# Patient Record
Sex: Female | Born: 1965 | Race: Black or African American | Hispanic: No | Marital: Single | Smoking: Never smoker
Health system: Southern US, Community
[De-identification: ages and names within clinical notes are randomized; demographics above are authoritative.]

## PROBLEM LIST (undated history)

## (undated) DIAGNOSIS — Z8619 Personal history of other infectious and parasitic diseases: Secondary | ICD-10-CM

## (undated) DIAGNOSIS — D259 Leiomyoma of uterus, unspecified: Secondary | ICD-10-CM

## (undated) DIAGNOSIS — R7303 Prediabetes: Secondary | ICD-10-CM

## (undated) DIAGNOSIS — Z8742 Personal history of other diseases of the female genital tract: Secondary | ICD-10-CM

## (undated) DIAGNOSIS — D509 Iron deficiency anemia, unspecified: Secondary | ICD-10-CM

## (undated) DIAGNOSIS — M1990 Unspecified osteoarthritis, unspecified site: Secondary | ICD-10-CM

## (undated) DIAGNOSIS — I10 Essential (primary) hypertension: Secondary | ICD-10-CM

## (undated) DIAGNOSIS — Z8719 Personal history of other diseases of the digestive system: Secondary | ICD-10-CM

## (undated) DIAGNOSIS — N921 Excessive and frequent menstruation with irregular cycle: Secondary | ICD-10-CM

## (undated) DIAGNOSIS — Z87442 Personal history of urinary calculi: Secondary | ICD-10-CM

## (undated) HISTORY — DX: Essential (primary) hypertension: I10

## (undated) HISTORY — DX: Excessive and frequent menstruation with irregular cycle: N92.1

## (undated) HISTORY — PX: TONSILLECTOMY: SUR1361

## (undated) HISTORY — DX: Iron deficiency anemia, unspecified: D50.9

---

## 2002-06-30 ENCOUNTER — Encounter: Payer: Self-pay | Admitting: Emergency Medicine

## 2002-06-30 ENCOUNTER — Emergency Department (HOSPITAL_COMMUNITY): Admission: EM | Admit: 2002-06-30 | Discharge: 2002-06-30 | Payer: Self-pay | Admitting: Emergency Medicine

## 2003-08-03 ENCOUNTER — Emergency Department (HOSPITAL_COMMUNITY): Admission: EM | Admit: 2003-08-03 | Discharge: 2003-08-03 | Payer: Self-pay | Admitting: Emergency Medicine

## 2004-02-13 ENCOUNTER — Other Ambulatory Visit: Admission: RE | Admit: 2004-02-13 | Discharge: 2004-02-13 | Payer: Self-pay | Admitting: Obstetrics and Gynecology

## 2005-05-12 ENCOUNTER — Other Ambulatory Visit: Admission: RE | Admit: 2005-05-12 | Discharge: 2005-05-12 | Payer: Self-pay | Admitting: Obstetrics & Gynecology

## 2006-06-08 ENCOUNTER — Other Ambulatory Visit: Admission: RE | Admit: 2006-06-08 | Discharge: 2006-06-08 | Payer: Self-pay | Admitting: Obstetrics & Gynecology

## 2006-07-26 ENCOUNTER — Other Ambulatory Visit: Admission: RE | Admit: 2006-07-26 | Discharge: 2006-07-26 | Payer: Self-pay | Admitting: Obstetrics & Gynecology

## 2007-03-08 ENCOUNTER — Other Ambulatory Visit: Admission: RE | Admit: 2007-03-08 | Discharge: 2007-03-08 | Payer: Self-pay | Admitting: Obstetrics and Gynecology

## 2007-07-19 ENCOUNTER — Other Ambulatory Visit: Admission: RE | Admit: 2007-07-19 | Discharge: 2007-07-19 | Payer: Self-pay | Admitting: Obstetrics and Gynecology

## 2007-09-21 ENCOUNTER — Encounter: Admission: RE | Admit: 2007-09-21 | Discharge: 2007-09-21 | Payer: Self-pay | Admitting: Obstetrics and Gynecology

## 2008-12-13 ENCOUNTER — Encounter: Admission: RE | Admit: 2008-12-13 | Discharge: 2008-12-13 | Payer: Self-pay | Admitting: Obstetrics and Gynecology

## 2009-02-27 HISTORY — PX: CYSTOSCOPY W/ URETERAL STENT PLACEMENT: SHX1429

## 2009-04-18 HISTORY — PX: OTHER SURGICAL HISTORY: SHX169

## 2009-12-31 ENCOUNTER — Ambulatory Visit (HOSPITAL_COMMUNITY): Admission: RE | Admit: 2009-12-31 | Discharge: 2009-12-31 | Payer: Self-pay | Admitting: Obstetrics & Gynecology

## 2009-12-31 HISTORY — PX: ROBOTIC ASSISTED DIAGNOSTIC LAPAROSCOPY: SHX6532

## 2010-03-03 ENCOUNTER — Encounter: Admission: RE | Admit: 2010-03-03 | Discharge: 2010-03-03 | Payer: Self-pay | Admitting: Obstetrics & Gynecology

## 2010-03-07 ENCOUNTER — Encounter
Admission: RE | Admit: 2010-03-07 | Discharge: 2010-03-07 | Payer: Self-pay | Source: Home / Self Care | Attending: Obstetrics & Gynecology | Admitting: Obstetrics & Gynecology

## 2010-06-11 LAB — CBC
HCT: 39 % (ref 36.0–46.0)
MCH: 28 pg (ref 26.0–34.0)
MCHC: 33.5 g/dL (ref 30.0–36.0)
MCV: 83.8 fL (ref 78.0–100.0)
Platelets: 358 10*3/uL (ref 150–400)
RDW: 13.9 % (ref 11.5–15.5)
WBC: 5.3 10*3/uL (ref 4.0–10.5)

## 2010-06-11 LAB — SURGICAL PCR SCREEN: MRSA, PCR: NEGATIVE

## 2010-06-11 LAB — BASIC METABOLIC PANEL
BUN: 12 mg/dL (ref 6–23)
CO2: 28 mEq/L (ref 19–32)
Chloride: 104 mEq/L (ref 96–112)
Creatinine, Ser: 0.88 mg/dL (ref 0.4–1.2)
Glucose, Bld: 94 mg/dL (ref 70–99)
Potassium: 3.8 mEq/L (ref 3.5–5.1)

## 2011-11-17 ENCOUNTER — Other Ambulatory Visit: Payer: Self-pay | Admitting: Obstetrics & Gynecology

## 2011-11-17 ENCOUNTER — Ambulatory Visit
Admission: RE | Admit: 2011-11-17 | Discharge: 2011-11-17 | Disposition: A | Discharge status: Home / Self Care | Payer: BC Managed Care – PPO | Source: Ambulatory Visit | Attending: Obstetrics & Gynecology | Admitting: Obstetrics & Gynecology

## 2011-11-17 DIAGNOSIS — Z1231 Encounter for screening mammogram for malignant neoplasm of breast: Secondary | ICD-10-CM

## 2012-08-19 ENCOUNTER — Encounter: Payer: Self-pay | Admitting: Certified Nurse Midwife

## 2012-08-23 ENCOUNTER — Ambulatory Visit (INDEPENDENT_AMBULATORY_CARE_PROVIDER_SITE_OTHER): Payer: BC Managed Care – PPO | Admitting: Certified Nurse Midwife

## 2012-08-23 ENCOUNTER — Encounter: Payer: Self-pay | Admitting: Certified Nurse Midwife

## 2012-08-23 VITALS — BP 120/84 | Wt 222.0 lb

## 2012-08-23 DIAGNOSIS — N946 Dysmenorrhea, unspecified: Secondary | ICD-10-CM

## 2012-08-23 DIAGNOSIS — B373 Candidiasis of vulva and vagina: Secondary | ICD-10-CM

## 2012-08-23 DIAGNOSIS — N760 Acute vaginitis: Secondary | ICD-10-CM

## 2012-08-23 MED ORDER — METRONIDAZOLE 0.75 % VA GEL: 1.0000 | Freq: Two times a day (BID) | VAGINAL | Status: DC

## 2012-08-23 MED ORDER — IBUPROFEN 800 MG PO TABS: 800.0000 mg | ORAL_TABLET | Freq: Three times a day (TID) | ORAL | Status: DC | PRN

## 2012-08-23 MED ORDER — FLUCONAZOLE 150 MG PO TABS: 150.0000 mg | ORAL_TABLET | Freq: Once | ORAL | Status: DC

## 2012-08-23 NOTE — Progress Notes (Signed)
47 y.o.SingleAfrican American female G0P0000 with a 3 months  history of the following:discharge described as white, milky and slight odor Sexually active: no Last sexual activity:unknown.  Declines STD concerns or testing desired. Pt also reports the following associated symptoms: none Patient has not tried over the counter treatment. Patient complaining she had to come in for the appointment instead of being treated over the phone. Patient also unhappy with Micronor use for contraception.  Aware of bleeding profile per previous visit consult regarding, but still not happy with.  Patient still has dysmenorrhea with use.  Uses OTC pain medication for relief, which helps somewhat. Patient continues on hypertensive medication, with somewhat good control. Strong family history of hypertension, CVD.  O: Healthy obese female Affect: Anxious, orientation x 3 Skin: warm and dry   Exam:  WGN:FAOZHY, Bartholin's, Urethra, Skene's normal                QMV:HQIONGEXB: white, thin and malodorous, pH 5.0, wet prep done                Cx:  normal appearance and non tender                Uterus:nodular, upper limits of normal size                Adnexa: normal adnexa and no mass, fullness, tenderness  Wet Prep shows:clue cells and yeast, negative for trich   A: Normal pelvic exam with nodular uterus consistent with history of fibroids. Bacterial vaginosis Yeast vaginitis Dysmenorrhea   P: Reviewed findings of yeast vaginitis and bacterial vaginitis Rx Diflucan see order Rx Metrogel see order Aveeno or baking sitz bath prn comfort. Discussed with patient that we had dicussed other  options for contraceptive use and management of dysmenorrhea at previous visit 04-08-12 and she had declined. Patient offered visit with MD, declined.  Considering IVF so doesn't want to do anything at this point even though she is unhappy with choice.  Will advise if desires change.  Rv prn    Reviewed, TL

## 2012-09-01 ENCOUNTER — Telehealth: Payer: Self-pay | Admitting: Certified Nurse Midwife

## 2012-09-01 NOTE — Telephone Encounter (Signed)
Patient came in last Wednesday and was Dx'ed with a yeast infection and a bacterial infection. Was given meds for this . Has not fully gone away. Please call  Patient is out of town at this time [facility]. Patient is in [place]

## 2012-09-01 NOTE — Telephone Encounter (Signed)
Pt was treated for BV and Yeast, has taken all of prescribed medication symptoms are much better but still having a cottage cheese discharge. Took diflucan Sunday 4 days later not any better Pt asking if we can call in another round of Diflucan. cm

## 2012-09-01 NOTE — Telephone Encounter (Signed)
PATIENT NOTIFIED OF D. LEONARD RESPONSE TO PATIENT YEAST INFECTION. PATIENT UNDERSTANDS RESPONSE FROM D. LEONARD.

## 2012-09-01 NOTE — Telephone Encounter (Signed)
It takes approximately 7 days to clear after second dose, can use aveeno sitz bath for comfort, if not resolving by then OV

## 2012-09-28 ENCOUNTER — Other Ambulatory Visit: Payer: Self-pay | Admitting: Certified Nurse Midwife

## 2012-09-28 NOTE — Telephone Encounter (Signed)
AEX scheduled for 11/21/12 #28/2 refills sent to last pt until AEX

## 2012-10-07 ENCOUNTER — Encounter: Payer: Self-pay | Admitting: Certified Nurse Midwife

## 2012-10-07 ENCOUNTER — Ambulatory Visit (INDEPENDENT_AMBULATORY_CARE_PROVIDER_SITE_OTHER): Payer: BC Managed Care – PPO | Admitting: Certified Nurse Midwife

## 2012-10-07 VITALS — BP 110/72 | HR 60 | Resp 16 | Ht 61.75 in | Wt 219.0 lb

## 2012-10-07 DIAGNOSIS — N760 Acute vaginitis: Secondary | ICD-10-CM

## 2012-10-07 MED ORDER — METRONIDAZOLE 0.75 % VA GEL: 1.0000 | Freq: Every day | VAGINAL | Status: DC

## 2012-10-07 NOTE — Progress Notes (Signed)
47 y.o.SingleAfrican American female G0P0000 with BV treated here which she thinks  never cleared from May 2014 history of the following:discharge described as watery yellow discharge with odor off and on Sexually active: yes. Contraception: Errin  Pt also reports the following associated symptoms: none Patient has not tried over the counter treatment. Feels like it is the same as before. Patient currently has had an abscess tooth for the past 2 months and has surgery scheduled to repair. Worried this will affect her vaginal discharge from clearing. No new personal products or STD concerns    O: Healthy overweight female Affect:normal, orientation x 3    Exam:  ZOX:WRUEAV, Bartholin's, Urethra, Skene's normal                WUJ:WJXBJYNWG: yellow, watery and malodorous, pH 5.5, wet prep done                Cx:  normal appearance and non tender, no CMT                Uterus:firm nodular, consistent with history of fibroids                Adnexa: normal adnexa and no mass, fullness, tenderness  Wet Prep shows:Positive for BV, negative for yeast, Trich   A: BV, chronic? 2-Abscess tooth under treatment  P:Discussed findings with patient.  Rx Metrogel see order. Patient to treat with Metrogel she has at home for 5 days, start on oral probiotic, then one applicator only of Metrogel after period for the next 3 months and then re-evaluate. Patient agreeable with plan. 2-Continue follow up as indicated with tooth.  Rv prn  Reviewed, TL

## 2012-10-11 ENCOUNTER — Telehealth: Payer: Self-pay | Admitting: Certified Nurse Midwife

## 2012-10-11 NOTE — Telephone Encounter (Signed)
Patient notified of instructions per D.Leonard response. Patient understands this and was appreciative of call.

## 2012-10-11 NOTE — Telephone Encounter (Signed)
Patient calling with concerns of already being Treated for yeast and BV. States yeast infection has not gone away and is worse with more discharge of cottage cheese discharge . Took Diflucan as ordered and is on 5th day of Metrogel at [employer]. States there is no itching , no burning, no stinging. Only discharge that she can feel and see. She is taking a Probiotic as suggested due to her schedule of dental procedure on July 25th/ please advise .  Last AEX 11/17/2011 Last Office Visit 10/07/2012

## 2012-10-11 NOTE — Telephone Encounter (Signed)
Left message on CB# to return call .

## 2012-10-11 NOTE — Telephone Encounter (Signed)
We discussed the discharge is to be expected this is the medication working to clear out the overgrowth of the bacteria. Continue medication as prescribed. The discharge should last about a week and then gradually decrease the second week.  She can use aveeno bath to help with feeling fresh with the discharge.

## 2012-10-11 NOTE — Telephone Encounter (Signed)
Patient was seen last Friday 10/07/12 for a bacterial infection, patient thinks she really has a yeast infection. Patient would like to talk to a nurse.

## 2012-11-21 ENCOUNTER — Ambulatory Visit: Payer: Self-pay | Admitting: Certified Nurse Midwife

## 2012-11-23 ENCOUNTER — Ambulatory Visit: Payer: Self-pay | Admitting: Certified Nurse Midwife

## 2012-12-22 ENCOUNTER — Other Ambulatory Visit: Payer: Self-pay | Admitting: Certified Nurse Midwife

## 2012-12-23 NOTE — Telephone Encounter (Signed)
eScribe request for refill on CAMILA Last filled - 11/19/11 X 1 YEAR Last AEX - 11/17/11 Next AEX - not scheduled 1 month supply sent with note and phone call that pt needs AEX for more refills.

## 2013-01-19 ENCOUNTER — Telehealth: Payer: Self-pay | Admitting: Certified Nurse Midwife

## 2013-01-19 NOTE — Telephone Encounter (Signed)
Patient requesting a refill on Camilla please? AEX 01/26/13 with Ortencia Kick.  [facility]

## 2013-01-20 MED ORDER — NORETHINDRONE 0.35 MG PO TABS: 1.0000 | ORAL_TABLET | Freq: Every day | ORAL | Status: DC

## 2013-01-20 NOTE — Telephone Encounter (Signed)
AEX scheduled for 01/26/13 #28/0rf's sent to last patient until AEX.  Lm on patient's VM that refill has been sent and that it's important for her to make her Appt before she she can get any additional refills.

## 2013-01-26 ENCOUNTER — Ambulatory Visit: Payer: BC Managed Care – PPO | Admitting: Nurse Practitioner

## 2013-01-26 ENCOUNTER — Ambulatory Visit (INDEPENDENT_AMBULATORY_CARE_PROVIDER_SITE_OTHER): Payer: BC Managed Care – PPO | Admitting: Nurse Practitioner

## 2013-01-26 ENCOUNTER — Encounter: Payer: Self-pay | Admitting: Nurse Practitioner

## 2013-01-26 ENCOUNTER — Ambulatory Visit: Payer: Self-pay | Admitting: Certified Nurse Midwife

## 2013-01-26 VITALS — BP 132/84 | HR 68 | Resp 16 | Ht 62.0 in | Wt 225.0 lb

## 2013-01-26 DIAGNOSIS — Z113 Encounter for screening for infections with a predominantly sexual mode of transmission: Secondary | ICD-10-CM

## 2013-01-26 DIAGNOSIS — Z0000 Encounter for general adult medical examination without abnormal findings: Secondary | ICD-10-CM

## 2013-01-26 DIAGNOSIS — D219 Benign neoplasm of connective and other soft tissue, unspecified: Secondary | ICD-10-CM

## 2013-01-26 DIAGNOSIS — I10 Essential (primary) hypertension: Secondary | ICD-10-CM

## 2013-01-26 DIAGNOSIS — Z01419 Encounter for gynecological examination (general) (routine) without abnormal findings: Secondary | ICD-10-CM

## 2013-01-26 DIAGNOSIS — D259 Leiomyoma of uterus, unspecified: Secondary | ICD-10-CM

## 2013-01-26 DIAGNOSIS — R319 Hematuria, unspecified: Secondary | ICD-10-CM

## 2013-01-26 LAB — POCT URINALYSIS DIPSTICK
Bilirubin, UA: NEGATIVE
Ketones, UA: NEGATIVE
Protein, UA: NEGATIVE
pH, UA: 6

## 2013-01-26 MED ORDER — NORETHINDRONE 0.35 MG PO TABS: 1.0000 | ORAL_TABLET | Freq: Every day | ORAL | Status: DC

## 2013-01-26 NOTE — Patient Instructions (Signed)

## 2013-01-26 NOTE — Progress Notes (Signed)
Patient ID: Angel Bailey, female   DOB: 1965/06/20, 47 y.o.   MRN: [id number] 47 y.o. G0P0 Single African American Fe here for annual exam. patient is on HTN med's and therefore on Camilla.  Menses is usually 9-11 days.  Heavy for   3 days. Ultra tampon changing every 1.5 hours. cramps on heavy days with low back pain that occurs 1 week prior to cycle .  Vaginal discharge for almost a year.  Treated twice with yeast then for BV.  This summer she had a dental abscess, then diagnosed with BV and tried treatment monthly with Metrogel after the cycle for 1 day.  No treatment seems to help.  States discharge is daily without odor.  Occasionally white in color.  Same partner for several years but not sexually active.  He has a 51 &11 year old child. She feels that he is not as committed to a baby as she is.  Still holding out hope that one day she may want a pregnancy but also OK to adopt.  Patient's last menstrual period was 01/07/2013.          Sexually active: no  The current method of family planning is POP (progesterone).    Exercising: no  The patient does not participate in regular exercise at present. Smoker:  no  Health Maintenance: Pap:  11/17/11, WNL, pos HR HPV, neg 16/18 MMG:  11/17/11, Bi-Rads 1: negative TDaP:  ? Labs: HB: 9.9   Urine: trace blood, small leuk's, 6.0 pH asymptomatic   reports that she has never smoked. She has never used smokeless tobacco. She reports that she drinks alcohol. She reports that she does not use illicit drugs.  Past Medical History  Diagnosis Date  . HSV (herpes simplex virus) infection   . HPV (human papilloma virus) infection   . Anemia   . STD (sexually transmitted disease)     positive Gonorrhea 3/08  . Fibroid     largest 2.4 cm  . Hypertension   . Kidney stone     Past Surgical History  Procedure Laterality Date  . Kidney stone surgery  1/11    stint  . Laparoscopic salpingoopherectomy Left 12/31/09    with extensive lysis of adhesions     Current Outpatient Prescriptions  Medication Sig Dispense Refill  . BIOTIN PO Take by mouth daily.      Marland Kitchen ibuprofen (ADVIL,MOTRIN) 800 MG tablet Take 1 tablet (800 mg total) by mouth every 8 (eight) hours as needed for pain. Take with food every 8 hours for cramping x 24 hours  30 tablet  1  . norethindrone (CAMILA) 0.35 MG tablet Take 1 tablet (0.35 mg total) by mouth daily. Must have AEX for more refills.  3 Package  3  . Prenatal Vit-Fe Sulfate-FA (PRENATAL VITAMIN PO) Take by mouth daily.      . valsartan-hydrochlorothiazide (DIOVAN-HCT) 160-25 MG per tablet Take 1 tablet by mouth daily.       No current facility-administered medications for this visit.    Family History  Problem Relation Age of Onset  . Hypertension Mother   . Diabetes Mother   . Hypertension Father   . Hypertension Maternal Grandmother     ROS:  Pertinent items are noted in HPI.  Otherwise, a comprehensive ROS was negative.  Exam:   BP 132/84  Pulse 68  Resp 16  Ht 5\' 2"  (1.575 m)  Wt 225 lb (102.059 kg)  BMI 41.14 kg/m2  LMP 01/07/2013 Height: 5\' 2"  (  157.5 cm)  Ht Readings from Last 3 Encounters:  01/26/13 5\' 2"  (1.575 m)  10/07/12 5' 1.75" (1.568 m)   During labs patient was near syncopal. Recovered quickly.  General appearance: alert, cooperative and appears stated age Head: Normocephalic, without obvious abnormality, atraumatic Neck: no adenopathy, supple, symmetrical, trachea midline and thyroid normal to inspection and palpation Lungs: clear to auscultation bilaterally Breasts: normal appearance, no masses or tenderness Heart: regular rate and rhythm Abdomen: soft, non-tender; no masses,  no organomegaly Extremities: extremities normal, atraumatic, no cyanosis or edema Skin: Skin color, texture, turgor normal. No rashes or lesions Lymph nodes: Cervical, supraclavicular, and axillary nodes normal. No abnormal inguinal nodes palpated Neurologic: Grossly normal   Pelvic: External  genitalia:  no lesions              Urethra:  normal appearing urethra with no masses, tenderness or lesions              Bartholin's and Skene's: normal                 Vagina: normal appearing vagina with normal color and discharge, no lesions. No vaginal discharge today              Cervix: anteverted              Pap taken: yes along with STD's Bimanual Exam:  Uterus:  enlarged, 10 weeks size              Adnexa: normal adnexa               Rectovaginal: Confirms               Anus:  normal sphincter tone, no lesions  A:  Well Woman with normal exam  History of menorrhagia and anemia on POP for almost a year  History of HTN  History of UTE fibroids,  With Laparoscopic LOA and endometrioma with LSO 12/31/09  History of normal pap with + HR HPV; negative # 16 & 18  - 10/13  History of recurrent vaginitis no help with BV / yeast med's  R/O STD's  P:   Pap smear as per guidelines Will follow this pap  Mammogram due now and will schedule  Discussed various treatment options with HTA, Mirena IUD, surgical option.  She became very emotional and did not want to do anything if she could not get pregnant in the future.  She did agree to PUS/ [facility] with Dr. Hyacinth Meeker.  Will need Cytotec.  Counseled on breast self exam, adequate intake of calcium and vitamin D, diet and exercise return annually or prn

## 2013-01-26 NOTE — Progress Notes (Signed)
Reviewed personally.  M. Suzanne Norine Reddington, MD.  

## 2013-01-27 LAB — STD PANEL

## 2013-01-28 LAB — IPS N GONORRHOEA AND CHLAMYDIA BY PCR

## 2013-01-28 LAB — URINE CULTURE

## 2013-01-30 ENCOUNTER — Telehealth: Payer: Self-pay | Admitting: Nurse Practitioner

## 2013-01-30 DIAGNOSIS — N920 Excessive and frequent menstruation with regular cycle: Secondary | ICD-10-CM

## 2013-01-30 LAB — IPS PAP TEST WITH HPV

## 2013-01-30 NOTE — Telephone Encounter (Signed)
Spoke with patient in regards to her benefits for a SHGM. Patient will call back to schedule, she does not have her work schedule in front of her. Patient would like to know the results of her labs from 10/30. Please advise.

## 2013-01-30 NOTE — Telephone Encounter (Signed)
LMTCB to discuss ins benefits for a SHGM and to schedule.   LMP: 01/07/13 usually lasts 9-11 days Schedule with MSM.

## 2013-01-31 NOTE — Telephone Encounter (Signed)
Patient calling to schedule her [facility] and also to request her recent results please?

## 2013-01-31 NOTE — Telephone Encounter (Signed)
Kennon Rounds I have tried to route this to Dr. Hyacinth Meeker and I am unable to do this.  Will  you send to her.

## 2013-01-31 NOTE — Telephone Encounter (Signed)
Return call to patient, advised of lab and pap results.  Discussed OTC iron supplement daily and to take with Vit c and avoid taking with calcium.  Will mail list of iron rich foods.  Patient on POP and state not sexually active.  Bleeding is irregular and unpredictable and lasts 10 days when starts. PUS/SHGM scheduled for 02-09-13. Instructed on use of Cytotec and to take Motrin 800 mg 1 hour prior with food.  States this has not been helping her so discussed trying Aleve instead. Advised ok to have procedure if bleeding as long as she is not uncomfortable with this. Orders entered, added poss endo biopsy in case needed. Cytotec entered.  Routing to provider for final review. Patient agreeable to disposition. Will close encounter

## 2013-02-01 MED ORDER — MISOPROSTOL 200 MCG PO TABS: ORAL_TABLET | ORAL | Status: DC

## 2013-02-01 NOTE — Telephone Encounter (Signed)
Encounter was routed to both Patty and Dr Hyacinth Meeker on 01-31-13 for review since is Patty's patient and reviewed by Dr Hyacinth Meeker who will do procedure.

## 2013-02-01 NOTE — Telephone Encounter (Signed)
Routed to Dr. Hyacinth Meeker per Patty's request.

## 2013-02-03 ENCOUNTER — Other Ambulatory Visit: Payer: Self-pay | Admitting: Certified Nurse Midwife

## 2013-02-03 NOTE — Telephone Encounter (Signed)
RX x 1 year sent on 01/26/13.  RX denied.

## 2013-02-09 ENCOUNTER — Ambulatory Visit (INDEPENDENT_AMBULATORY_CARE_PROVIDER_SITE_OTHER): Payer: BC Managed Care – PPO | Admitting: Obstetrics & Gynecology

## 2013-02-09 ENCOUNTER — Ambulatory Visit (INDEPENDENT_AMBULATORY_CARE_PROVIDER_SITE_OTHER): Payer: BC Managed Care – PPO

## 2013-02-09 ENCOUNTER — Other Ambulatory Visit: Payer: Self-pay | Admitting: Obstetrics & Gynecology

## 2013-02-09 DIAGNOSIS — N920 Excessive and frequent menstruation with regular cycle: Secondary | ICD-10-CM

## 2013-02-09 DIAGNOSIS — I10 Essential (primary) hypertension: Secondary | ICD-10-CM

## 2013-02-09 DIAGNOSIS — D259 Leiomyoma of uterus, unspecified: Secondary | ICD-10-CM

## 2013-02-09 DIAGNOSIS — D649 Anemia, unspecified: Secondary | ICD-10-CM

## 2013-02-09 DIAGNOSIS — D219 Benign neoplasm of connective and other soft tissue, unspecified: Secondary | ICD-10-CM

## 2013-02-09 NOTE — Progress Notes (Signed)
47 y.o.Singlefemale here for a pelvic ultrasound with sonohystogram due to menorrhagia, anemia, and h/o uterine fibroids.  Bleeding is much worse on miconor which was initiated due to her hypertension.  Pt here for u/s to see if there are any changes and possible options for treatment.  She really wants to maintain her fertility, although she clearly realizes her age is also a factor.  Not in serious relationship.  She is contemplating donor egg, if personal life circumstances allowed for this.  Indication: menorrhagia, anemia, h/o fibroids  Patient's last menstrual period was 02/04/2013.  Contraception: micronor  Technique:  Both transabdominal and transvaginal ultrasound examinations of the pelvis were performed. Transabdominal technique was performed for global imaging of the pelvis including uterus, ovaries, adnexal regions, and pelvic cul-de-sac. It was necessary to proceed with endovaginal exam following the abdominal ultrasound transabdominal exam to visualize the endometrium and adnexa. Color and duplex Doppler ultrasound was utilized to evaluate blood flow to the ovaries.   FINDINGS: UTERUS: 11.0 x 8.4 x 7.2cm with 3 fibroids--3.5 x 3.0 x 3.0cm, 5.9 x 6 x 7cm, and 1.8 x 1.7 x 1.9cm.  Largest is larger than prior exam in 2011 where was 3cm.  Overall uterine size is large by 3cm in almost each dimension. EMS: very difficult to evaluate due to distortion from fibroids ADNEXA: Left--surgically absent       Right--4.9 x 4.0 x 3.1cm with 32mm cyst with thick walls, internal scattered low level echoes.  Avascular. CUL DE SAC: no free fluid  SHSG:  After obtaining appropriate verbal consent from patient, the cervix was visualized using a speculum, and prepped with betadine.  A tenaculum  was applied to the cervix.  Dilation of the cervix was not necessary. The catheter was passed into the uterus and sterile saline introduced, with the following findings: endometrial cavity has 90 degree angle due  to distortion from fibroid.  Clot noted in cavity.  No intracavitary lesions.  Largest fibroid is very close to edge of endometrium.  Images and results disucss with pt.  I do not feel that she is a good candidate for considering estrogen containing OCPs.  Risk of stroke discussed with pt.  She voices understanding.  Mirena IUD also not a good option due to shape of endometrial cavity and risk for perforation of uterus.  Pt not interested in hysterectomy.  Myomectomy discussed.  Open vs laparoscopic procedure discussed.  Risks of interruption of endometrial cavity with subsequent scar discussed.  Incision locations and risk for future fibroid growth in addition to need for future surgeries discussed.  Assessment:  Enlarging uterine fibroids/uterine size, menorrhagia, anemia  Plan:  Pt contemplating options.  Starting FeSo4 daily.  Recommended to take with OJ.  Pt will need repeat CBC in 2 months to watch anemia and ensure not worsening.  Pt very appreciative of time spent with her answering all her questions.  ~30 minutes spent with patient >50% of time was in face to face discussion of above.

## 2013-02-09 NOTE — Patient Instructions (Signed)
Myomectomy Myoma is a non-cancerous tumor made up of fibrous tissue. It is also called leiomyoma, but more often called a fibroid tumor. Myomectomy is the removal of a fibroid tumor without removing another organ, like the uterus or ovary, with it. Fibroids range from the size of a pea to a grapefruit. They are rarely cancerous. Myomas only need treatment when they are growing or when they cause symptoms, such aspain, pressure, bleeding, and pain with intercourse. LET YOUR CAREGIVER KNOW ABOUT:  Any allergies, especially to medicines.  If you develop a cold or an infection before your surgery.  Medicines taken, including vitamins, herbs, eyedrops, over-ther-counter medicines, and creams.  Use of steroids (by mouth or creams).  Previous problems with numbing medicines.  History of blood clots or other bleeding problems.  Other health problems, such as diabetes, kidney, heart, or lung problems.  Previous surgery.  Possibility of pregnancy, if this applies. RISKS AND COMPLICATIONS   Excessive bleeding.  Infection.  Injury to other organs.  Blood clots in the legs, chest, and brain.  Scar tissue (adhesions) on other organs and in the pelvis.  Death during or after the surgery. BEFORE THE PROCEDURE  Follow your caregiver's advice regarding your surgery and preparing for surgery.  Avoid taking aspirin or blood thinners as directed by your caregiver.  DO NOT eat or drink anything after midnight on the night before surgery, or as directed by your caregiver.  DO NOT smoke (if you smoke) for 2 weeks before the surgery.  DO NOT drink alcohol the day before the surgery.  If you are admitted the day of the surgery,arrive1 hour before your surgery is scheduled.  Arrange to have someone take you home from the hospital.  Arrange to have someone care for you when you go home. PROCEDURE There are several ways to perform a myomectomy:  Hysteroscopy myomectomy. A lighted tube is  inserted inside the uterus. The tube will remove the fibroid. This is used when the fibroid is inside the cavity of the uterus.  Laparoscopic myomectomy. A long, lighted tube is inserted through 2 or 3 small incisions to see the organs in the pelvis. The fibroid is removed.  Myomectomy through a sugical cut (incicion) in the abdomen. The fibroid is removed through an incision made in the stomach. This way is performed when thethe fibroid cannot be removed with a hysteroscope or laprascope. AFTER THE PROCEDURE  If you had laparoscopic or hysteroscopic myomectomy, you may go home the same day or stay overnight.  If you had abdominal myomectomy, you may stay in the hospital a few days.  Your intravenous (IV)access tube and catheter will be removed in 1 or 2 days.  If you stay in the hospital, your caregiver will order pain medicine and a sleeping pill, if needed.  You may be placed on an antibiotic medicine, if needed.  You may be given written instructions and medicines before you are sent home. Document Released: 01/11/2007 Document Revised: 06/08/2011 Document Reviewed: 01/23/2009 [facility] Patient Information 2014 [place], [place].   [email or web address]  [email or web address]  Dr. April Manson

## 2013-02-10 ENCOUNTER — Encounter: Payer: Self-pay | Admitting: Obstetrics & Gynecology

## 2013-02-10 DIAGNOSIS — N920 Excessive and frequent menstruation with regular cycle: Secondary | ICD-10-CM | POA: Insufficient documentation

## 2013-02-10 DIAGNOSIS — D649 Anemia, unspecified: Secondary | ICD-10-CM | POA: Insufficient documentation

## 2013-02-10 DIAGNOSIS — I10 Essential (primary) hypertension: Secondary | ICD-10-CM | POA: Insufficient documentation

## 2013-02-10 DIAGNOSIS — D219 Benign neoplasm of connective and other soft tissue, unspecified: Secondary | ICD-10-CM | POA: Insufficient documentation

## 2013-03-20 ENCOUNTER — Other Ambulatory Visit: Payer: Self-pay | Admitting: Certified Nurse Midwife

## 2013-03-21 ENCOUNTER — Telehealth: Payer: Self-pay | Admitting: Nurse Practitioner

## 2013-03-21 NOTE — Telephone Encounter (Signed)
01/26/13 #90/3 [facility] S/w pharmacist they didn't have rx gave them a verbal order Called patient notified her of this and apologized for any incovenience Patient appreciative.

## 2013-03-21 NOTE — Telephone Encounter (Signed)
Patient called again saying that the pharmacy still doesn't have rx, I told the patient I called it in. Patient said she spoke with the pharmacist and they didn't have it. Patient is also needing one month called into [facility] since she isn't here, tried to have them transfer 1 month but they didn't have rx. Patient was anxious because she is 2 days late and needs to start her pack today.  Called pharmacy s/w pharmacist she said they do have the rx they just didn't put it in and they would, she apologized. I asked if they could please get it ready since the patient called [place] (twice) , they said they would.  [facility] in Camilla 0.35 mg #1 pack only (as patient requested)  Notified patient.

## 2013-03-21 NOTE — Telephone Encounter (Signed)
Angel [facility] [place] [place]. Patient says she did not get a prescription at her AEX.

## 2013-06-01 ENCOUNTER — Telehealth: Payer: Self-pay

## 2013-06-01 NOTE — Telephone Encounter (Signed)
Lmtcb//kn 

## 2013-06-01 NOTE — Telephone Encounter (Signed)
Message copied by Robley Fries on Thu Jun 01, 2013 11:27 AM ------      Message from: Megan Salon      Created: Fri May 19, 2013 10:41 PM      Regarding: CBC follow up       Pt hasn't come for follow up CBC.  Can you call and remind her?  Order is in [place]. ------

## 2013-06-07 ENCOUNTER — Encounter: Payer: Self-pay | Admitting: Nurse Practitioner

## 2013-06-07 ENCOUNTER — Other Ambulatory Visit (INDEPENDENT_AMBULATORY_CARE_PROVIDER_SITE_OTHER): Payer: BC Managed Care – PPO

## 2013-06-07 ENCOUNTER — Ambulatory Visit (INDEPENDENT_AMBULATORY_CARE_PROVIDER_SITE_OTHER): Payer: BC Managed Care – PPO | Admitting: Nurse Practitioner

## 2013-06-07 VITALS — BP 140/86 | HR 76 | Ht 62.0 in | Wt 232.0 lb

## 2013-06-07 DIAGNOSIS — D649 Anemia, unspecified: Secondary | ICD-10-CM

## 2013-06-07 DIAGNOSIS — B3731 Acute candidiasis of vulva and vagina: Secondary | ICD-10-CM

## 2013-06-07 DIAGNOSIS — B373 Candidiasis of vulva and vagina: Secondary | ICD-10-CM

## 2013-06-07 LAB — CBC
HEMATOCRIT: 32.1 % — AB (ref 36.0–46.0)
Hemoglobin: 9.9 g/dL — ABNORMAL LOW (ref 12.0–15.0)
MCH: 23.1 pg — ABNORMAL LOW (ref 26.0–34.0)
MCHC: 30.8 g/dL (ref 30.0–36.0)
MCV: 75 fL — ABNORMAL LOW (ref 78.0–100.0)
Platelets: 485 10*3/uL — ABNORMAL HIGH (ref 150–400)
RBC: 4.28 MIL/uL (ref 3.87–5.11)
RDW: 16.9 % — AB (ref 11.5–15.5)
WBC: 4.7 10*3/uL (ref 4.0–10.5)

## 2013-06-07 MED ORDER — FLUCONAZOLE 150 MG PO TABS: ORAL_TABLET | ORAL | Status: DC

## 2013-06-07 NOTE — Progress Notes (Signed)
Subjective:     Patient ID: Barbette Merino, female   DOB: [birth year], 48 y.o.   MRN: [id number]  HPI  This 48 yo G0 SAA Fe complains of vaginal symptoms on and off for a year and half.  She has been treated for yeast and bacterial vaginitis.  Currently the discharge is white and thick at time. Sometimes has an odor and itching. At one time with a tooth infection the symptoms were worse and did seem to improve with Metrogel.  She denies urinary symptoms. Not SA since 2010 and all STD's since then are negative.  Review of Systems  Constitutional: Negative for fever, chills and fatigue.  Gastrointestinal: Negative for nausea, vomiting, abdominal pain, diarrhea, constipation, blood in stool, abdominal distention and anal bleeding.  Genitourinary: Positive for vaginal discharge. Negative for dysuria, urgency, frequency, hematuria, flank pain, genital sores, vaginal pain and pelvic pain.  Musculoskeletal: Negative.   Neurological: Negative.   Psychiatric/Behavioral: Negative.        Objective:   Physical Exam  Constitutional: She is oriented to person, place, and time. She appears well-developed and well-nourished. She appears distressed.  Abdominal: Soft. She exhibits no distension. There is no tenderness. There is no rebound.  Genitourinary:  White thick vaginal discharge. No cervical erosion or other lesions.  Wet Prep:  Ph: 4.5; NSS: negative; KOH: + multiple yeast.  Neurological: She is alert and oriented to person, place, and time.  Psychiatric: She has a normal mood and affect. Her behavior is normal. Judgment and thought content normal.       Assessment:     Yeast vaginitis    Plan:     Diflucan 150 mg today and repeat in 3 days; then continue for another 2 doses at a weekly dosing until completed because of the chronic nature of her symptoms. If symptoms worsen in any way to call back.

## 2013-06-07 NOTE — Patient Instructions (Addendum)
Monilial Vaginitis Vaginitis in a soreness, swelling and redness (inflammation) of the vagina and vulva. Monilial vaginitis is not a sexually transmitted infection. CAUSES  Yeast vaginitis is caused by yeast (candida) that is normally found in your vagina. With a yeast infection, the candida has overgrown in number to a point that upsets the chemical balance. SYMPTOMS   White, thick vaginal discharge.  Swelling, itching, redness and irritation of the vagina and possibly the lips of the vagina (vulva).  Burning or painful urination.  Painful intercourse. DIAGNOSIS  Things that may contribute to monilial vaginitis are:  Postmenopausal and virginal states.  Pregnancy.  Infections.  Being tired, sick or stressed, especially if you had monilial vaginitis in the past.  Diabetes. Good control will help lower the chance.  Birth control pills.  Tight fitting garments.  Using bubble bath, feminine sprays, douches or deodorant tampons.  Taking certain medications that kill germs (antibiotics).  Sporadic recurrence can occur if you become ill. TREATMENT  Your caregiver will give you medication.  There are several kinds of anti monilial vaginal creams and suppositories specific for monilial vaginitis. For recurrent yeast infections, use a suppository or cream in the vagina 2 times a week, or as directed.  Anti-monilial or steroid cream for the itching or irritation of the vulva may also be used. Get your caregiver's permission.  Painting the vagina with methylene blue solution may help if the monilial cream does not work.  Eating yogurt may help prevent monilial vaginitis. HOME CARE INSTRUCTIONS   Finish all medication as prescribed.  Do not have sex until treatment is completed or after your caregiver tells you it is okay.  Take warm sitz baths.  Do not douche.  Do not use tampons, especially scented ones.  Wear cotton underwear.  Avoid tight pants and panty  hose.  Tell your sexual partner that you have a yeast infection. They should go to their caregiver if they have symptoms such as mild rash or itching.  Your sexual partner should be treated as well if your infection is difficult to eliminate.  Practice safer sex. Use condoms.  Some vaginal medications cause latex condoms to fail. Vaginal medications that harm condoms are:  Cleocin cream.  Butoconazole (Femstat).  Terconazole (Terazol) vaginal suppository.  Miconazole (Monistat) (may be purchased over the counter). SEEK MEDICAL CARE IF:   You have a temperature by mouth above 102 F (38.9 C).  The infection is getting worse after 2 days of treatment.  The infection is not getting better after 3 days of treatment.  You develop blisters in or around your vagina.  You develop vaginal bleeding, and it is not your menstrual period.  You have pain when you urinate.  You develop intestinal problems.  You have pain with sexual intercourse. Document Released: 12/24/2004 Document Revised: 06/08/2011 Document Reviewed: 09/07/2008 [facility] Patient Information 2014 [place], [place].   Diflucan 150 mg today  And repeat in 3 days, then on 3/21/ and 3/28 repeat 1 tablet of Diflucan

## 2013-06-13 NOTE — Progress Notes (Signed)
Encounter reviewed by Dr. Camylle Whicker Silva.  

## 2013-07-06 ENCOUNTER — Other Ambulatory Visit: Payer: Self-pay

## 2013-07-06 DIAGNOSIS — Z1231 Encounter for screening mammogram for malignant neoplasm of breast: Secondary | ICD-10-CM

## 2013-07-12 ENCOUNTER — Telehealth: Payer: Self-pay | Admitting: Nurse Practitioner

## 2013-07-12 NOTE — Telephone Encounter (Signed)
Spoke with patient. Advised not to take any extra tablets. Advised to start a new pack and take one pill each day of new pack. She has started a period which I advised patient is normal after being off POPs for 4 days. Denies bleeding concerns, states that periods "for me,they are always heavy, but I have fibroids."  Patient does not want to drive today so she will pick up new pack tomorrow and start new pack. Advised will need to use back up method of birth control for one month and patient states she is not concerned, she is not sexually active. Patient verbalized understanding of instructions.   Routing to provider for final review. Patient agreeable to disposition. Will close encounter

## 2013-07-12 NOTE — Telephone Encounter (Signed)
Patient was out of town and unable to get her birthcontrol. Patient was due to start taking them on Sunday does she needs to take all of the missing pills today?

## 2013-07-21 ENCOUNTER — Encounter (INDEPENDENT_AMBULATORY_CARE_PROVIDER_SITE_OTHER): Payer: Self-pay

## 2013-07-21 ENCOUNTER — Ambulatory Visit
Admission: RE | Admit: 2013-07-21 | Discharge: 2013-07-21 | Disposition: A | Discharge status: Home / Self Care | Payer: Self-pay | Source: Ambulatory Visit

## 2013-07-21 DIAGNOSIS — Z1231 Encounter for screening mammogram for malignant neoplasm of breast: Secondary | ICD-10-CM

## 2013-10-05 NOTE — Telephone Encounter (Signed)
Spoke with patient-states has had blood work with PCP. She will call their office to make sure her iron levels were checked as well and have them fax [place] the results. If she has not had them checked, will schedule appointment with us.//kn

## 2013-10-05 NOTE — Telephone Encounter (Signed)
Per DPR ok to leave detailed message on cell #-left message that she is overdue for repeat lab work and would she like to schedule this?//kn

## 2013-10-09 ENCOUNTER — Telehealth: Payer: Self-pay | Admitting: Certified Nurse Midwife

## 2013-10-09 MED ORDER — NORETHINDRONE 0.35 MG PO TABS: 1.0000 | ORAL_TABLET | Freq: Every day | ORAL | Status: DC

## 2013-10-09 NOTE — Telephone Encounter (Signed)
Refill request for Camuy Last filled by MD on - 01/26/13, #3 X 3 Last AEX - 01/26/13 Next AEX - 02/02/14  Pt transferred original RX to [place].  Per Tye Maryland at [facility], pt last had filled on 07/12/13 and has no refills.  She is unable to see any additional RX details.  [facility]ills [facility] to [facility] until AEX.  Confirmed location as phone number given does not match any phone numbers listed in EPIC for any [facility] in [place].  Message left at [phone] (H) per ROI that RX has been sent to pharmacy.

## 2013-10-09 NOTE — Telephone Encounter (Signed)
Pt [facility] on [facility] [place] at [phone].

## 2013-12-07 ENCOUNTER — Encounter: Payer: Self-pay | Admitting: Nurse Practitioner

## 2013-12-26 ENCOUNTER — Telehealth: Payer: Self-pay | Admitting: Nurse Practitioner

## 2013-12-26 NOTE — Telephone Encounter (Signed)
Pt says she has been on cycle since the 17th and is still on. Please call to advise. She has stopped taking her birth control thinking this may be the problem.

## 2013-12-26 NOTE — Telephone Encounter (Signed)
Spoke with patient. Patient states that 1 1/2 to 2 years ago her cycle began to get heavier and more painful. Patient states that she was told this changed due to fibroids. Patient started cycle on 9/17 and has not stopped bleeding since. Patient states that she has to change her pad/tampon every hour and a half "this is my normal." Patient has been taking camilla but stopped taking on Thursday. "I feel like it has not been helping and there is no point in me taking it if it isn't going to make a difference and I am not sexually active." Advised patient stopping pill could also extend cycle. States that she feels like her cycle is going to stop and then she will get a "down pour" of blood. Advised patient will need to see MD for evaluation. Patient is agreeable and verbalizes understanding. Patient requests appointment for Friday. Appointment scheduled Friday 10/2 at 9:30am with Dr.Lathrop. Patient agreeable to date and time.   Cc: Dr.Lathrop  Routing to provider for final review. Patient agreeable to disposition. Will close encounter

## 2013-12-29 ENCOUNTER — Ambulatory Visit (INDEPENDENT_AMBULATORY_CARE_PROVIDER_SITE_OTHER): Payer: BC Managed Care – PPO | Admitting: Gynecology

## 2013-12-29 ENCOUNTER — Encounter: Payer: Self-pay | Admitting: Gynecology

## 2013-12-29 VITALS — BP 132/84 | Resp 18 | Ht 62.0 in | Wt 215.0 lb

## 2013-12-29 DIAGNOSIS — N760 Acute vaginitis: Secondary | ICD-10-CM

## 2013-12-29 DIAGNOSIS — D509 Iron deficiency anemia, unspecified: Secondary | ICD-10-CM

## 2013-12-29 DIAGNOSIS — D250 Submucous leiomyoma of uterus: Secondary | ICD-10-CM

## 2013-12-29 DIAGNOSIS — N920 Excessive and frequent menstruation with regular cycle: Secondary | ICD-10-CM

## 2013-12-29 NOTE — Progress Notes (Signed)
Subjective:     Patient ID: Angel Bailey, female   DOB: 04-06-1965, 48 y.o.   MRN: [id number]  HPI Comments: Pt is here for 2 issues. Pt reports that her cycles are heavy, long and with many clots despite being in progestin only ocp.  Pt states that she will bleed for 9-11d, changing super + tampons every 1-1.5h. Pt reports severe cramping that has not imporved on ocp.  Pt had been on estrogen containing ocp but was switched to POP due to elevated BP.  Since she did not see an improvement from the pill, she elected to stop it. Pt had an U/S with [facility] in 11/14 that showed an increase in size of her largest fibroid from 2.4cm to 7.2x6cm, 2 smaller fibroids were noted.  In addition, the largest fibroid is deflecting her uterus posteriorly. Pt is not sexually active, she is torn between preserving her fertility vs more definitve treatment for her menorrhagia.  Pt was offered a myomectomy in the past but did not pursue. In addition, pt reports recurrent vaginitis, pt has treated with both pill and creams but does not seem to go away, described as thick, slight tinge, no odor.  No itching    Review of Systems Per HPI    Objective:   Physical Exam  Nursing note and vitals reviewed. Constitutional: She is oriented to person, place, and time. She appears well-developed and well-nourished.  Genitourinary:   Pelvic: External genitalia:  no lesions              Urethra:  normal appearing urethra with no masses, tenderness or lesions              Bartholins and Skenes: normal                 Vagina: normal appearing vagina with normal color and discharge, no lesions              Cervix: normal appearance                     Bimanual Exam:  Uterus: enlarged, non-tender, mobile                                      Adnexa: not palpable                                      Neurological: She is alert and oriented to person, place, and time.       Assessment:     Fibroid uterus Menorrhagia not  responding to ocp Anemia Desire for fertility     Plan:     I had a long discussion with pt regarding her options, she is not 100% sure that she wants to give up on having a child but she also reports that she "cannot live this way".  Pt is not in a relationship.  We discussed again the IUD which is not feasible due to the shape of her uterus. I suggest checking an FSH and AMH so she will know the likelihood of natural conception-she agrees that will help with her decision We suggested referring to interventional radiology to see if shrinking the fibroids but that conception afterwards is not recommended, expect improvement in her cycle She may also consider a myomectomy hysterectomy also discussed All risks/benefits/post-op  recovery. If pt has normal FSH, she would like referral to perinatology to discuss her risks of pregnancy-reviewed risks of elevated BP, PTL, need for c/s, risk of DM. All questions addressed WP-no yeast- pt informed. CBC done Total length of consult 61m, >50% face to face

## 2013-12-30 LAB — CBC
HCT: 28.1 % — ABNORMAL LOW (ref 36.0–46.0)
Hemoglobin: 8.4 g/dL — ABNORMAL LOW (ref 12.0–15.0)
MCH: 20.5 pg — ABNORMAL LOW (ref 26.0–34.0)
MCHC: 29.9 g/dL — ABNORMAL LOW (ref 30.0–36.0)
MCV: 68.7 fL — AB (ref 78.0–100.0)
PLATELETS: 639 10*3/uL — AB (ref 150–400)
RBC: 4.09 MIL/uL (ref 3.87–5.11)
RDW: 16.9 % — AB (ref 11.5–15.5)
WBC: 5.2 10*3/uL (ref 4.0–10.5)

## 2013-12-30 LAB — FOLLICLE STIMULATING HORMONE: FSH: 21.4 m[IU]/mL

## 2014-01-02 ENCOUNTER — Telehealth: Payer: Self-pay | Admitting: Gynecology

## 2014-01-02 LAB — ANTI MULLERIAN HORMONE: AMH AssessR: 0.03 ng/mL

## 2014-01-02 MED ORDER — FUSION PLUS PO CAPS
1.0000 | ORAL_CAPSULE | ORAL | Status: DC
Start: 2014-01-02 — End: 2014-02-08

## 2014-01-02 NOTE — Telephone Encounter (Signed)
Pt informed that her Holt is 21.4, higher than typical to sustain a pregnancy, her AMH is still pending.  Her Hb has continued to decrease and is now 8.4 down from 9.9 71m ago.  Pt reports buying but not taking iron supplementation, discussed importance of increasing her Hb/Hct.  Referral to IR sent in, will wait to sent referral to either or both REI or perinatology pending AMH and her decision, pt agreeable, questions addressed

## 2014-01-03 ENCOUNTER — Telehealth: Payer: Self-pay | Admitting: Obstetrics & Gynecology

## 2014-01-03 ENCOUNTER — Telehealth: Payer: Self-pay | Admitting: Gynecology

## 2014-01-03 DIAGNOSIS — D251 Intramural leiomyoma of uterus: Secondary | ICD-10-CM

## 2014-01-03 DIAGNOSIS — N921 Excessive and frequent menstruation with irregular cycle: Secondary | ICD-10-CM

## 2014-01-03 NOTE — Telephone Encounter (Signed)
Pt informed of AMH 7.57, and implications on pregnancy discussed. In addition, [place] is elevated. Pt is upset, pregnancy was desired. Information on [employer] sent to pt, will call back when she is better able to speak (up to date info)

## 2014-01-03 NOTE — Telephone Encounter (Signed)
Pt wants to have a consult with dr Sabra Heck about some news she got this mroning from dr lathrop. Pt is upset and crying about the bad news. And wants to just talk to dr Sabra Heck about it asap.

## 2014-01-03 NOTE — Telephone Encounter (Signed)
Left message to call [place] at [phone].   Need to advise per Dr.Lathrop patient needs to start taking oral Iron at this time to bring level up. Patient needs to have consult with Dr.Miller on Oct 19th per Gay Filler. Also need to ask if she would like form from Dr.Lathrop faxed to her.

## 2014-01-03 NOTE — Telephone Encounter (Signed)
Spoke with patient at time of incoming call. Patient states that she was seen on 10/02 with Dr.Lathrop and had a series of tests run. Patient received a call yesterday and today from Dr.Lathrop regarding results. Patient begins to cry. "Pretty much I am not able to get pregnant and I have a fibroid the size of an orange." Patient states that she is not sure what she needs to do from here. Patient states "Dr.Lathrop is great and is supposed to call me back later today but I would like to come in next week to speak with Dr.Miller about surgery for my fibroid." Patient's hemoglobin is now 8.4 and was 9.9 6 months ago. Patient was prescribed iron by Dr.Lathrop but has not started taking this. "I don't know if that is going to bring my level up fast enough. I don't know if I will need a shot. I want to get surgery soon but cannot with a low level like that." Advised patient will have to speak with provider regarding this and give her a call back. Patient is out of town and is not available until next week to come in to speak with Dr.Miller. Advised will have to check scheduling and give her a call back with further appointment recommendations. Patient is agreeable.

## 2014-01-04 NOTE — Telephone Encounter (Signed)
Patient has referral for IR for evaluation.  Per Felipa Emory, with IR referral needs order placed for IR so that appointment can be made. Order placed for IR evaluation and management.

## 2014-01-05 NOTE — Telephone Encounter (Signed)
Return call to Kaitlyn. °

## 2014-01-05 NOTE — Telephone Encounter (Signed)
Spoke with patient. Patient states that she has started taking the iron that Dr.Lathrop prescribed her. Patient would like results from Dr.Lathrop sent to the address on file instead of being faxed. Address verified with patient. Patient would like to schedule appointment with Dr.Miller at this time for surgery consult. Appointment scheduled for 10/19 at 9:30am with [place] (time per Gay Filler). Patient agreeable to date and time.  Cc: Dr.Miller  Routing to provider for final review. Patient agreeable to disposition. Will close encounter

## 2014-01-15 ENCOUNTER — Other Ambulatory Visit: Payer: Self-pay | Admitting: *Deleted

## 2014-01-15 ENCOUNTER — Ambulatory Visit (INDEPENDENT_AMBULATORY_CARE_PROVIDER_SITE_OTHER): Payer: BC Managed Care – PPO | Admitting: Obstetrics & Gynecology

## 2014-01-15 VITALS — BP 118/82 | HR 60 | Resp 16 | Ht 62.0 in | Wt 212.0 lb

## 2014-01-15 DIAGNOSIS — Z319 Encounter for procreative management, unspecified: Secondary | ICD-10-CM

## 2014-01-15 DIAGNOSIS — D500 Iron deficiency anemia secondary to blood loss (chronic): Secondary | ICD-10-CM

## 2014-01-15 DIAGNOSIS — D251 Intramural leiomyoma of uterus: Secondary | ICD-10-CM

## 2014-01-15 NOTE — Progress Notes (Signed)
Subjective:     Patient ID: Angel Bailey, female   DOB: 09-29-1965, 48 y.o.   MRN: [id number]  HPI 48 yo G0 SAAF here for discussion of cycles and recent fertility testing as well as possible surgery.  Pt with known hx of fibroids and DUB/menorrhagia with associated anemia.  Pt and I have talked over the past several years about possible treatment options.  Pt has been adamant about not doing anything to her uterus if at all possible.  She was recently seen by Dr. Charlies Constable who did an Bunker (21) and AMH (0.04) on her.  Pt was told she could not have children and to quote the patient "that I don't work with anybody with these numbers on fertility issues" by Dr. Charlies Constable.  Pt is very tearful today and her mother is with her.  She does not want to give up the possibility of having a pregnancy but is resigned to the fact that she will have to proceed with a hysterectomy and she really doesn't want to do this.    D/W pt there are always fertility options but it depends on the desires of the individual.  She has always desired being married first but that does not seem to be in the immediate future.  Pt is aware she has very poor ovarian reserve and the possibility of pregnancy with her own eggs is very slim.  However, donor egg is a possibility.  Shared with her two experiences of my patients with donor egg.  She is very interested in this.  She knows there will be cost involved but I also discussed with her two patients who received donor embryo.  Both now have children.   Pt has known fibroids with a large 7cm one.  Last PUS was 11/14.  Uterus then was 11 x 8.4 x 7.29m with 3 fibroids measuring 3.5cm, 7cm, and 1.9cm.  She really needs to consider myomectomy before proceeding with donor egg.  She is very open to this.     Reports she stopped the micronor as she was having so much irregular bleeding.  She is on iron.  Last hemoglobin was 8.4 on 12/29/13.  Denies light headedness, SOB, or palpitations.  Does have some  fatigue but feels she always does.  Pt aware she will most likely need to receive transfusion at time of procedure due to hemoglobin level.  She is ok with this.  Review of Systems  All other systems reviewed and are negative.      Objective:   Physical Exam  Constitutional: She is oriented to person, place, and time.  Neurological: She is alert and oriented to person, place, and time.  Skin: Skin is warm and dry.  Psychiatric: She has a normal mood and affect.   No other physical exam performed today    Assessment:     48 yo with fibroid uterus, largest fibroid 7cm Anemia Desires pregnancy with donor egg.  (Her mother is willing to help with cost if donated embryo not possible.)     Plan:     Will refer to Dr. Kerin Perna for consultation regarding donor egg and laparoscopic myomectomy. Stay on iron      ~45 minutes spent with patient >50% of time was in face to face discussion of above.

## 2014-01-17 ENCOUNTER — Encounter: Payer: Self-pay | Admitting: Obstetrics & Gynecology

## 2014-01-17 DIAGNOSIS — D251 Intramural leiomyoma of uterus: Secondary | ICD-10-CM | POA: Insufficient documentation

## 2014-01-17 DIAGNOSIS — D500 Iron deficiency anemia secondary to blood loss (chronic): Secondary | ICD-10-CM | POA: Insufficient documentation

## 2014-02-02 ENCOUNTER — Ambulatory Visit: Payer: BC Managed Care – PPO | Admitting: Nurse Practitioner

## 2014-02-08 ENCOUNTER — Encounter: Payer: Self-pay | Admitting: Nurse Practitioner

## 2014-02-08 ENCOUNTER — Ambulatory Visit (INDEPENDENT_AMBULATORY_CARE_PROVIDER_SITE_OTHER): Payer: BC Managed Care – PPO | Admitting: Nurse Practitioner

## 2014-02-08 VITALS — BP 116/70 | HR 76 | Ht 62.0 in | Wt 210.0 lb

## 2014-02-08 DIAGNOSIS — Z0000 Encounter for general adult medical examination without abnormal findings: Secondary | ICD-10-CM

## 2014-02-08 DIAGNOSIS — D500 Iron deficiency anemia secondary to blood loss (chronic): Secondary | ICD-10-CM

## 2014-02-08 DIAGNOSIS — Z01419 Encounter for gynecological examination (general) (routine) without abnormal findings: Secondary | ICD-10-CM

## 2014-02-08 DIAGNOSIS — N760 Acute vaginitis: Secondary | ICD-10-CM

## 2014-02-08 LAB — POCT URINALYSIS DIPSTICK
BILIRUBIN UA: NEGATIVE
Glucose, UA: NEGATIVE
Ketones, UA: NEGATIVE
LEUKOCYTES UA: NEGATIVE
NITRITE UA: NEGATIVE
PH UA: 6
Protein, UA: NEGATIVE
RBC UA: NEGATIVE
UROBILINOGEN UA: NEGATIVE

## 2014-02-08 MED ORDER — FUSION PLUS PO CAPS: 1.0000 | ORAL_CAPSULE | ORAL | Status: DC

## 2014-02-08 NOTE — Progress Notes (Signed)
Patient ID: Angel Bailey, female   DOB: 22-May-1965, 48 y.o.   MRN: [id number] 48 y.o. G0P0 Single African American Fe here for annual exam.  Magical mystery tours.in May.  LMP are regular and still heavy.  Off Camilla since 01/20/14.  On the POP vaginal bleeding was much worse and increase in cramps. Saw Dr. Carmela Rima on Tuesday and will start on a new med's to help reduce fibroid and help with bleeding. Per pharmacy the 2 RX is Letrozole and Danazole.  She is going to have myomectomy done in the near future.  She is still interested in pursuing donor egg for fertility.  Patient's last menstrual period was 01/16/2014.          Sexually active: no  The current method of family planning is none. Exercising: yes, elliptical, treadmill and walking Smoker: no  Health Maintenance: Pap: 01/26/13, negative with neg HR HPV; 11/17/11, WNL, pos HR HPV, neg 16/18 MMG: 07/21/13, Bi-Rads 1: negative TDaP: UTD Labs: HB:  12/2013  Urine:  Negative    reports that she has never smoked. She has never used smokeless tobacco. She reports that she drinks alcohol. She reports that she does not use illicit drugs.  Past Medical History  Diagnosis Date  . HSV (herpes simplex virus) infection   . HPV (human papilloma virus) infection   . Anemia   . STD (sexually transmitted disease)     positive Gonorrhea 3/08  . Fibroid     largest 5.7cm  . Hypertension   . Kidney stone     Past Surgical History  Procedure Laterality Date  . Kidney stone surgery  1/11    stint  . Laparoscopic salpingoopherectomy Left 12/31/09    with extensive lysis of adhesions    Current Outpatient Prescriptions  Medication Sig Dispense Refill  . Acetaminophen (TYLENOL PO) Take by mouth.    . Iron-FA-B Cmp-C-Biot-Probiotic (FUSION PLUS) CAPS Take 1 capsule by mouth 1 day or 1 dose. 30 capsule 11  . valsartan-hydrochlorothiazide (DIOVAN-HCT) 160-25 MG per tablet Take 1 tablet by mouth daily.     No current facility-administered  medications for this visit.    Family History  Problem Relation Age of Onset  . Hypertension Mother   . Diabetes Mother   . Hypertension Father   . Hypertension Maternal Grandmother     ROS:  Pertinent items are noted in HPI.  Otherwise, a comprehensive ROS was negative.  Exam:   BP 116/70 mmHg  Pulse 76  Ht 5\' 2"  (1.575 m)  Wt 210 lb (95.255 kg)  BMI 38.40 kg/m2  LMP 01/16/2014 Height: 5\' 2"  (157.5 cm)  Ht Readings from Last 3 Encounters:  02/08/14 5\' 2"  (1.575 m)  01/15/14 5\' 2"  (1.575 m)  12/29/13 5\' 2"  (1.575 m)    General appearance: alert, cooperative and appears stated age Head: Normocephalic, without obvious abnormality, atraumatic Neck: no adenopathy, supple, symmetrical, trachea midline and thyroid normal to inspection and palpation Lungs: clear to auscultation bilaterally Breasts: normal appearance, no masses or tenderness Heart: regular rate and rhythm Abdomen: soft, non-tender; no masses,  no organomegaly Extremities: extremities normal, atraumatic, no cyanosis or edema Skin: Skin color, texture, turgor normal. No rashes or lesions Lymph nodes: Cervical, supraclavicular, and axillary nodes normal. No abnormal inguinal nodes palpated Neurologic: Grossly normal   Pelvic: External genitalia:  no lesions              Urethra:  normal appearing urethra with no masses, tenderness or lesions  Bartholin's and Skene's: normal                 Vagina: normal appearing vagina with normal color and discharge, no lesions              Cervix: anteverted              Pap taken: Yes.   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

## 2014-02-08 NOTE — Patient Instructions (Signed)

## 2014-02-09 ENCOUNTER — Telehealth: Payer: Self-pay | Admitting: *Deleted

## 2014-02-09 ENCOUNTER — Other Ambulatory Visit: Payer: Self-pay | Admitting: Certified Nurse Midwife

## 2014-02-09 DIAGNOSIS — D509 Iron deficiency anemia, unspecified: Secondary | ICD-10-CM

## 2014-02-09 LAB — CBC WITH DIFFERENTIAL/PLATELET
Basophils Absolute: 0 10*3/uL (ref 0.0–0.1)
Basophils Relative: 0 % (ref 0–1)
EOS ABS: 0.2 10*3/uL (ref 0.0–0.7)
Eosinophils Relative: 3 % (ref 0–5)
HCT: 31.9 % — ABNORMAL LOW (ref 36.0–46.0)
HEMOGLOBIN: 9.3 g/dL — AB (ref 12.0–15.0)
LYMPHS ABS: 2.1 10*3/uL (ref 0.7–4.0)
Lymphocytes Relative: 36 % (ref 12–46)
MCH: 21.8 pg — AB (ref 26.0–34.0)
MCHC: 29.2 g/dL — ABNORMAL LOW (ref 30.0–36.0)
MCV: 74.9 fL — ABNORMAL LOW (ref 78.0–100.0)
Monocytes Absolute: 0.5 10*3/uL (ref 0.1–1.0)
Monocytes Relative: 8 % (ref 3–12)
NEUTROS PCT: 53 % (ref 43–77)
Neutro Abs: 3.1 10*3/uL (ref 1.7–7.7)
Platelets: 440 10*3/uL — ABNORMAL HIGH (ref 150–400)
RBC: 4.26 MIL/uL (ref 3.87–5.11)
RDW: 21.7 % — ABNORMAL HIGH (ref 11.5–15.5)
WBC: 5.8 10*3/uL (ref 4.0–10.5)

## 2014-02-09 LAB — HSV(HERPES SIMPLEX VRS) I + II AB-IGG
HSV 1 GLYCOPROTEIN G AB, IGG: 0.17 IV
HSV 2 Glycoprotein G Ab, IgG: 15.35 IV — ABNORMAL HIGH

## 2014-02-09 LAB — WET PREP BY MOLECULAR PROBE
Candida species: NEGATIVE
Gardnerella vaginalis: NEGATIVE
Trichomonas vaginosis: NEGATIVE

## 2014-02-09 NOTE — Telephone Encounter (Signed)
Patient notified of Affirm and CBC results as directed by Debbi. States surgery is planned with Dr Nelda Severe in next 2-4 weeks. Wants to know if Chong Sicilian wants her to plan repeat labs or if ok to defer these since will have has surgery. She is aware Angel Bailey is out of office and we will check this next week.

## 2014-02-09 NOTE — Telephone Encounter (Signed)
-----   Message from Regina Eck, CNM sent at 02/09/2014  9:02 AM EST ----- Notify on Affirm negative Hemoglobin is better continue iron foods and RX  Hgb. Is 9.3 Recheck CBC, iron profile in 6 weeks order in

## 2014-02-09 NOTE — Progress Notes (Signed)
Reviewed personally.  M. Suzanne Jacub Waiters, MD.  

## 2014-02-09 NOTE — Telephone Encounter (Signed)
I have attempted to contact this patient by phone with the following results: left message to return call to [place] at [phone] on answering machine (mobile per [facility]).  No personal information given. [phone] (mobile)

## 2014-02-12 NOTE — Telephone Encounter (Signed)
Sh can actually have CBC done with Dr. Carmela Rima.  She does need the other test results as well and decide on prn treatment for HSV.

## 2014-02-13 LAB — IPS PAP TEST WITH HPV

## 2014-02-14 NOTE — Telephone Encounter (Signed)
Notified of note below from [place], [place].  She is agreeable, and understands she will need labs here if Dr. Carmela Rima does not do them.  Pt will call back with decision regarding Valtrex and pharmacy.

## 2014-03-14 ENCOUNTER — Encounter (HOSPITAL_COMMUNITY): Admission: RE | Payer: Self-pay | Source: Ambulatory Visit

## 2014-03-14 ENCOUNTER — Ambulatory Visit (HOSPITAL_COMMUNITY)
Admission: RE | Admit: 2014-03-14 | Payer: BC Managed Care – PPO | Source: Ambulatory Visit | Admitting: Obstetrics and Gynecology

## 2014-03-14 SURGERY — LAPAROSCOPY OPERATIVE
Anesthesia: General

## 2014-04-13 ENCOUNTER — Encounter (HOSPITAL_BASED_OUTPATIENT_CLINIC_OR_DEPARTMENT_OTHER): Payer: Self-pay | Admitting: *Deleted

## 2014-04-13 NOTE — Progress Notes (Signed)
NPO AFTER MN. ARRIVE AT 0900. NEEDS ISTAT AND EKG.

## 2014-04-17 NOTE — Anesthesia Preprocedure Evaluation (Addendum)
Anesthesia Evaluation  Patient identified by MRN, date of birth, ID band Patient awake    Reviewed: Allergy & Precautions, NPO status , Patient's Chart, lab work & pertinent test results  History of Anesthesia Complications Negative for: history of anesthetic complications  Airway Mallampati: II  TM Distance: >3 FB Neck ROM: Full    Dental no notable dental hx. (+) Dental Advisory Given,    Pulmonary neg pulmonary ROS,  breath sounds clear to auscultation  Pulmonary exam normal       Cardiovascular hypertension, Pt. on medications Rhythm:Regular Rate:Normal     Neuro/Psych negative neurological ROS  negative psych ROS   GI/Hepatic negative GI ROS, Neg liver ROS,   Endo/Other  obesity  Renal/GU negative Renal ROS  negative genitourinary   Musculoskeletal negative musculoskeletal ROS (+)   Abdominal   Peds negative pediatric ROS (+)  Hematology  (+) anemia ,   Anesthesia Other Findings   Reproductive/Obstetrics negative OB ROS                           Anesthesia Physical Anesthesia Plan  ASA: II  Anesthesia Plan: General   Post-op Pain Management:    Induction: Intravenous  Airway Management Planned: Oral ETT  Additional Equipment:   Intra-op Plan:   Post-operative Plan: Extubation in OR  Informed Consent: I have reviewed the patients History and Physical, chart, labs and discussed the procedure including the risks, benefits and alternatives for the proposed anesthesia with the patient or authorized representative who has indicated his/her understanding and acceptance.   Dental advisory given  Plan Discussed with: CRNA  Anesthesia Plan Comments:        Anesthesia Quick Evaluation

## 2014-04-18 ENCOUNTER — Encounter (HOSPITAL_BASED_OUTPATIENT_CLINIC_OR_DEPARTMENT_OTHER)
Admission: RE | Disposition: A | Discharge status: Home / Self Care | Payer: Self-pay | Source: Ambulatory Visit | Attending: Obstetrics and Gynecology

## 2014-04-18 ENCOUNTER — Ambulatory Visit (HOSPITAL_BASED_OUTPATIENT_CLINIC_OR_DEPARTMENT_OTHER)
Admission: RE | Admit: 2014-04-18 | Discharge: 2014-04-18 | Disposition: A | Discharge status: Home / Self Care | Payer: BC Managed Care – PPO | Source: Ambulatory Visit | Attending: Obstetrics and Gynecology | Admitting: Obstetrics and Gynecology

## 2014-04-18 ENCOUNTER — Ambulatory Visit (HOSPITAL_BASED_OUTPATIENT_CLINIC_OR_DEPARTMENT_OTHER): Payer: BC Managed Care – PPO | Admitting: Certified Registered"

## 2014-04-18 ENCOUNTER — Encounter (HOSPITAL_BASED_OUTPATIENT_CLINIC_OR_DEPARTMENT_OTHER): Payer: Self-pay | Admitting: Anesthesiology

## 2014-04-18 DIAGNOSIS — N830 Follicular cyst of ovary: Secondary | ICD-10-CM | POA: Diagnosis not present

## 2014-04-18 DIAGNOSIS — Z87442 Personal history of urinary calculi: Secondary | ICD-10-CM | POA: Insufficient documentation

## 2014-04-18 DIAGNOSIS — D259 Leiomyoma of uterus, unspecified: Secondary | ICD-10-CM | POA: Diagnosis not present

## 2014-04-18 DIAGNOSIS — N895 Stricture and atresia of vagina: Secondary | ICD-10-CM | POA: Diagnosis not present

## 2014-04-18 DIAGNOSIS — N920 Excessive and frequent menstruation with regular cycle: Secondary | ICD-10-CM | POA: Insufficient documentation

## 2014-04-18 DIAGNOSIS — Z8249 Family history of ischemic heart disease and other diseases of the circulatory system: Secondary | ICD-10-CM | POA: Diagnosis not present

## 2014-04-18 DIAGNOSIS — I10 Essential (primary) hypertension: Secondary | ICD-10-CM | POA: Insufficient documentation

## 2014-04-18 DIAGNOSIS — Z6838 Body mass index (BMI) 38.0-38.9, adult: Secondary | ICD-10-CM | POA: Diagnosis not present

## 2014-04-18 HISTORY — DX: Personal history of other diseases of the digestive system: Z87.19

## 2014-04-18 HISTORY — DX: Personal history of other infectious and parasitic diseases: Z86.19

## 2014-04-18 HISTORY — DX: Personal history of other diseases of the female genital tract: Z87.42

## 2014-04-18 HISTORY — DX: Leiomyoma of uterus, unspecified: D25.9

## 2014-04-18 HISTORY — DX: Personal history of urinary calculi: Z87.442

## 2014-04-18 LAB — POCT I-STAT, CHEM 8
BUN: 12 mg/dL (ref 6–23)
CALCIUM ION: 1.21 mmol/L (ref 1.12–1.23)
CREATININE: 1 mg/dL (ref 0.50–1.10)
Chloride: 104 mEq/L (ref 96–112)
GLUCOSE: 90 mg/dL (ref 70–99)
HCT: 44 % (ref 36.0–46.0)
HEMOGLOBIN: 15 g/dL (ref 12.0–15.0)
Potassium: 4 mmol/L (ref 3.5–5.1)
Sodium: 143 mmol/L (ref 135–145)
TCO2: 25 mmol/L (ref 0–100)

## 2014-04-18 SURGERY — LAPAROSCOPIC GELPORT ASSISTED MYOMECTOMY
Anesthesia: General | Site: Abdomen

## 2014-04-18 MED ORDER — ONDANSETRON HCL 4 MG/2ML IJ SOLN
INTRAMUSCULAR | Status: DC | PRN
  Administered 2014-04-18: 4 mg via INTRAVENOUS

## 2014-04-18 MED ORDER — SUCCINYLCHOLINE CHLORIDE 20 MG/ML IJ SOLN
INTRAMUSCULAR | Status: DC | PRN
  Administered 2014-04-18: 100 mg via INTRAVENOUS

## 2014-04-18 MED ORDER — MIDAZOLAM HCL 2 MG/2ML IJ SOLN
INTRAMUSCULAR | Status: AC
  Filled 2014-04-18: qty 2

## 2014-04-18 MED ORDER — CEFAZOLIN SODIUM-DEXTROSE 2-3 GM-% IV SOLR
INTRAVENOUS | Status: AC
  Filled 2014-04-18: qty 50

## 2014-04-18 MED ORDER — SODIUM CHLORIDE 0.9 % IR SOLN
Status: DC | PRN
  Administered 2014-04-18: 500 mL

## 2014-04-18 MED ORDER — LACTATED RINGERS IR SOLN
Status: DC | PRN
  Administered 2014-04-18: 3000 mL

## 2014-04-18 MED ORDER — ONDANSETRON HCL 4 MG PO TABS: 4.0000 mg | ORAL_TABLET | Freq: Three times a day (TID) | ORAL | Status: DC | PRN

## 2014-04-18 MED ORDER — OXYCODONE-ACETAMINOPHEN 5-325 MG PO TABS
ORAL_TABLET | ORAL | Status: AC
  Filled 2014-04-18: qty 1

## 2014-04-18 MED ORDER — OXYCODONE-ACETAMINOPHEN 5-325 MG PO TABS
1.0000 | ORAL_TABLET | ORAL | Status: DC | PRN
  Administered 2014-04-18: 1 via ORAL
  Filled 2014-04-18: qty 1

## 2014-04-18 MED ORDER — LACTATED RINGERS IV SOLN
INTRAVENOUS | Status: DC | PRN
  Administered 2014-04-18 (×3): via INTRAVENOUS

## 2014-04-18 MED ORDER — CLINDAMYCIN PHOSPHATE 600 MG/50ML IV SOLN
INTRAVENOUS | Status: AC
  Filled 2014-04-18: qty 50

## 2014-04-18 MED ORDER — CIPROFLOXACIN IN D5W 400 MG/200ML IV SOLN
400.0000 mg | INTRAVENOUS | Status: DC
  Filled 2014-04-18: qty 200

## 2014-04-18 MED ORDER — FENTANYL CITRATE 0.05 MG/ML IJ SOLN
INTRAMUSCULAR | Status: DC | PRN
  Administered 2014-04-18 (×6): 25 ug via INTRAVENOUS
  Administered 2014-04-18: 50 ug via INTRAVENOUS
  Administered 2014-04-18 (×2): 25 ug via INTRAVENOUS

## 2014-04-18 MED ORDER — MIDAZOLAM HCL 5 MG/5ML IJ SOLN
INTRAMUSCULAR | Status: DC | PRN
  Administered 2014-04-18 (×2): 1 mg via INTRAVENOUS

## 2014-04-18 MED ORDER — CEFAZOLIN SODIUM-DEXTROSE 2-3 GM-% IV SOLR
2.0000 g | Freq: Once | INTRAVENOUS | Status: DC
  Filled 2014-04-18: qty 50

## 2014-04-18 MED ORDER — CLINDAMYCIN PHOSPHATE 900 MG/50ML IV SOLN
INTRAVENOUS | Status: AC
  Filled 2014-04-18: qty 50

## 2014-04-18 MED ORDER — METHYLENE BLUE 1 % INJ SOLN
INTRAMUSCULAR | Status: DC | PRN
  Administered 2014-04-18: 1 mL via SUBMUCOSAL

## 2014-04-18 MED ORDER — METOCLOPRAMIDE HCL 5 MG/ML IJ SOLN
INTRAMUSCULAR | Status: DC | PRN
  Administered 2014-04-18: 10 mg via INTRAVENOUS

## 2014-04-18 MED ORDER — GLYCOPYRROLATE 0.2 MG/ML IJ SOLN
INTRAMUSCULAR | Status: DC | PRN
  Administered 2014-04-18: 0.6 mg via INTRAVENOUS

## 2014-04-18 MED ORDER — ACETAMINOPHEN 10 MG/ML IV SOLN
INTRAVENOUS | Status: DC | PRN
  Administered 2014-04-18: 1000 mg via INTRAVENOUS

## 2014-04-18 MED ORDER — KETOROLAC TROMETHAMINE 30 MG/ML IJ SOLN
INTRAMUSCULAR | Status: DC | PRN
  Administered 2014-04-18: 30 mg via INTRAVENOUS

## 2014-04-18 MED ORDER — FENTANYL CITRATE 0.05 MG/ML IJ SOLN
INTRAMUSCULAR | Status: AC
  Filled 2014-04-18: qty 6

## 2014-04-18 MED ORDER — NEOSTIGMINE METHYLSULFATE 10 MG/10ML IV SOLN
INTRAVENOUS | Status: DC | PRN
  Administered 2014-04-18: 4 mg via INTRAVENOUS

## 2014-04-18 MED ORDER — STERILE WATER FOR IRRIGATION IR SOLN
Status: DC | PRN
  Administered 2014-04-18: 500 mL

## 2014-04-18 MED ORDER — ONDANSETRON HCL 4 MG/2ML IJ SOLN
4.0000 mg | Freq: Once | INTRAMUSCULAR | Status: DC | PRN
  Filled 2014-04-18: qty 2

## 2014-04-18 MED ORDER — ROCURONIUM BROMIDE 100 MG/10ML IV SOLN
INTRAVENOUS | Status: DC | PRN
  Administered 2014-04-18 (×2): 10 mg via INTRAVENOUS
  Administered 2014-04-18: 30 mg via INTRAVENOUS

## 2014-04-18 MED ORDER — OXYCODONE-ACETAMINOPHEN 7.5-325 MG PO TABS: 1.0000 | ORAL_TABLET | ORAL | Status: DC | PRN

## 2014-04-18 MED ORDER — LACTATED RINGERS IV SOLN
INTRAVENOUS | Status: DC
  Administered 2014-04-18: 10:00:00 via INTRAVENOUS
  Filled 2014-04-18: qty 1000

## 2014-04-18 MED ORDER — DEXAMETHASONE SODIUM PHOSPHATE 4 MG/ML IJ SOLN
INTRAMUSCULAR | Status: DC | PRN
  Administered 2014-04-18: 10 mg via INTRAVENOUS

## 2014-04-18 MED ORDER — VASOPRESSIN 20 UNIT/ML IV SOLN
INTRAVENOUS | Status: DC | PRN
  Administered 2014-04-18: 30 mL via INTRAMUSCULAR

## 2014-04-18 MED ORDER — FENTANYL CITRATE 0.05 MG/ML IJ SOLN
INTRAMUSCULAR | Status: AC
  Filled 2014-04-18: qty 2

## 2014-04-18 MED ORDER — LIDOCAINE HCL (CARDIAC) 20 MG/ML IV SOLN
INTRAVENOUS | Status: DC | PRN
  Administered 2014-04-18: 100 mg via INTRAVENOUS

## 2014-04-18 MED ORDER — FENTANYL CITRATE 0.05 MG/ML IJ SOLN
25.0000 ug | INTRAMUSCULAR | Status: DC | PRN
  Administered 2014-04-18 (×2): 25 ug via INTRAVENOUS
  Administered 2014-04-18: 50 ug via INTRAVENOUS
  Filled 2014-04-18: qty 1

## 2014-04-18 MED ORDER — PROPOFOL 10 MG/ML IV BOLUS
INTRAVENOUS | Status: DC | PRN
  Administered 2014-04-18: 200 mg via INTRAVENOUS

## 2014-04-18 MED ORDER — BUPIVACAINE-EPINEPHRINE 0.25% -1:200000 IJ SOLN
INTRAMUSCULAR | Status: DC | PRN
  Administered 2014-04-18: 15 mL

## 2014-04-18 MED ORDER — CLINDAMYCIN PHOSPHATE 900 MG/50ML IV SOLN
900.0000 mg | INTRAVENOUS | Status: AC
  Administered 2014-04-18: 900 mg via INTRAVENOUS
  Filled 2014-04-18: qty 50

## 2014-04-18 SURGICAL SUPPLY — 61 items
BLADE SURG 10 STRL SS (BLADE) ×3 IMPLANT
BLADE SURG 11 STRL SS (BLADE) ×3 IMPLANT
BRR ADH 6X5 SEPRAFILM 1 SHT (MISCELLANEOUS) ×2
CATH FOLEY 2WAY SLVR  5CC 14FR (CATHETERS) ×2
CATH FOLEY 2WAY SLVR 5CC 14FR (CATHETERS) ×1 IMPLANT
CATH ROBINSON RED A/P 16FR (CATHETERS) ×3 IMPLANT
COVER MAYO STAND STRL (DRAPES) ×3 IMPLANT
DRAPE CAMERA CLOSED 9X96 (DRAPES) ×3 IMPLANT
DRAPE UNDERBUTTOCKS STRL (DRAPE) ×3 IMPLANT
DRESSING TELFA ISLAND 4X8 (GAUZE/BANDAGES/DRESSINGS) IMPLANT
DRSG OPSITE POSTOP 3X4 (GAUZE/BANDAGES/DRESSINGS) ×3 IMPLANT
DRSG TEGADERM 4X4.75 (GAUZE/BANDAGES/DRESSINGS) IMPLANT
ELECT REM PT RETURN 9FT ADLT (ELECTROSURGICAL) ×3
ELECTRODE REM PT RTRN 9FT ADLT (ELECTROSURGICAL) ×1 IMPLANT
GLOVE BIO SURGEON STRL SZ8 (GLOVE) ×3 IMPLANT
GLOVE BIOGEL PI IND STRL 7.5 (GLOVE) ×1 IMPLANT
GLOVE BIOGEL PI IND STRL 8.5 (GLOVE) ×1 IMPLANT
GLOVE BIOGEL PI INDICATOR 7.5 (GLOVE) ×2
GLOVE BIOGEL PI INDICATOR 8.5 (GLOVE) ×2
GLOVE SURG SS PI 7.5 STRL IVOR (GLOVE) ×3 IMPLANT
GOWN STRL REUS W/ TWL XL LVL3 (GOWN DISPOSABLE) ×2 IMPLANT
GOWN STRL REUS W/TWL XL LVL3 (GOWN DISPOSABLE) ×6
HOLDER FOLEY CATH W/STRAP (MISCELLANEOUS) IMPLANT
LEGGING LITHOTOMY PAIR STRL (DRAPES) ×3 IMPLANT
LIQUID BAND (GAUZE/BANDAGES/DRESSINGS) ×3 IMPLANT
MANIPULATOR UTERINE 4.5 ZUMI (MISCELLANEOUS) ×3 IMPLANT
NEEDLE HYPO 22GX1.5 SAFETY (NEEDLE) ×3 IMPLANT
NEEDLE HYPO 25X1 1.5 SAFETY (NEEDLE) ×3 IMPLANT
NEEDLE INSUFFLATION 14GA 120MM (NEEDLE) ×3 IMPLANT
NS IRRIG 500ML POUR BTL (IV SOLUTION) ×3 IMPLANT
PACK BASIN DAY SURGERY FS (CUSTOM PROCEDURE TRAY) ×3 IMPLANT
PACK LAPAROSCOPY II (CUSTOM PROCEDURE TRAY) ×3 IMPLANT
PAD OB MATERNITY 4.3X12.25 (PERSONAL CARE ITEMS) ×3 IMPLANT
PENCIL BUTTON HOLSTER BLD 10FT (ELECTRODE) ×3 IMPLANT
SEPRAFILM MEMBRANE 5X6 (MISCELLANEOUS) ×6 IMPLANT
SET BERKELEY SUCTION TUBING (SUCTIONS) ×3 IMPLANT
SET IRRIG TUBING LAPAROSCOPIC (IRRIGATION / IRRIGATOR) ×3 IMPLANT
SOLUTION ANTI FOG 6CC (MISCELLANEOUS) ×3 IMPLANT
SPONGE LAP 18X18 X RAY DECT (DISPOSABLE) IMPLANT
STOPCOCK 4 WAY LG BORE MALE ST (IV SETS) ×3 IMPLANT
SUT MNCRL AB 4-0 PS2 18 (SUTURE) ×3 IMPLANT
SUT VIC AB 2-0 CT1 (SUTURE) ×3 IMPLANT
SUT VIC AB 2-0 CT2 27 (SUTURE) ×12 IMPLANT
SUT VIC AB 4-0 SH 27 (SUTURE) ×6
SUT VIC AB 4-0 SH 27XBRD (SUTURE) ×2 IMPLANT
SYR 20CC LL (SYRINGE) IMPLANT
SYR 30ML LL (SYRINGE) ×3 IMPLANT
SYR 3ML 23GX1 SAFETY (SYRINGE) ×3 IMPLANT
SYR 50ML LL SCALE MARK (SYRINGE) ×3 IMPLANT
SYR 5ML LL (SYRINGE) ×3 IMPLANT
SYR CONTROL 10ML LL (SYRINGE) ×12 IMPLANT
SYRINGE 10CC LL (SYRINGE) ×3 IMPLANT
SYS LAPSCP GELPORT 120MM (MISCELLANEOUS) ×3
SYSTEM LAPSCP GELPORT 120MM (MISCELLANEOUS) ×1 IMPLANT
TOWEL OR 17X24 6PK STRL BLUE (TOWEL DISPOSABLE) ×6 IMPLANT
TRAY DSU PREP LF (CUSTOM PROCEDURE TRAY) ×3 IMPLANT
TROCAR OPTI TIP 5M 100M (ENDOMECHANICALS) ×9 IMPLANT
TUBING INSUFFLATION 10FT LAP (TUBING) ×3 IMPLANT
WARMER LAPAROSCOPE (MISCELLANEOUS) ×3 IMPLANT
WATER STERILE IRR 500ML POUR (IV SOLUTION) ×3 IMPLANT
YANKAUER SUCT BULB TIP NO VENT (SUCTIONS) ×3 IMPLANT

## 2014-04-18 NOTE — Anesthesia Postprocedure Evaluation (Signed)
  Anesthesia Post-op Note  Patient: Angel Bailey  Procedure(s) Performed: Procedure(s): LAPAROSCOPIC GELPORT ASSISTED MYOMECTOMY, CHROMOTUBATION (N/A)  Patient Location: PACU  Anesthesia Type:General  Level of Consciousness: awake and alert   Airway and Oxygen Therapy: Patient Spontanous Breathing  Post-op Pain: mild  Post-op Assessment: Post-op Vital signs reviewed, Patient's Cardiovascular Status Stable and Respiratory Function Stable  Post-op Vital Signs: Reviewed  Filed Vitals:   04/18/14 1515  BP: 132/68  Pulse: 67  Temp:   Resp: 16    Complications: No apparent anesthesia complications

## 2014-04-18 NOTE — Op Note (Signed)
OPERATIVE NOTE  Preoperative diagnosis: Uterine myomas, menorrhagia  Postoperative diagnosis: Uterine myomas, menorrhagia  Procedure: Laparoscopy, GelPort assisted myomectomy  Anesthesia: General endotracheal  Surgeon:Awanda Wilcock  Asst.: None  Complications: None  Estimated blood loss: 100 mL  Specimens: Uterine myomas  Findings: Exam under anesthesia showed external genitalia, Bartholin's Skene's and urethra to be normal. The cervix was grossly normal except for an adhesion in the anterior vaginal fornix; the uterus was enlarged to 14 gestational weeks size with firm fibroid mass. There were no adnexal masses. The uterus sounded to 12 cm. On laparoscopy, there were adhesions between the liver edge and diaphragm. Gallbladder appeared normal. The left lobe of the liver appeared normal. Appendix was not visualized. The uterine contained a 6-7 cm anterior and fundal transmural fibroid with adjacent less than 1 cm fibroids 2. The posterior aspect of the large myoma extended up to the endometrium and endometrium was entered during the myomectomy. The left tube and ovary were surgically absent. The right tube appeared grossly normal. It did not fill with chromotubation but this may have been due to technical problem. The right ovary was wedged into the posterior cul-de-sac and enlarged with a follicular cyst of about 3 cm. It appeared to have a caudal adhesions the cul-de-sac. There was one spot of brown lesion in the right ovarian fossa. There were no other adhesions.  Description of the procedure: The patient was placed in dorsal supine position and general endotracheal anesthesia was given. 900 mg of clindamycin was given intravenously for prophylaxis. Patient was replaced in lithotomy position and was prepped and draped for laparoscopy. A Foley catheter was inserted into the bladder. A vaginal speculum was inserted. The cervix sounded to 12 cm. A ZUMI catheter was inserted into the  uterus and used for chromopertubation and uterine manipulation. It was attached to it 20 mL syringe containing diluted methylene blue. The surgeon was regloved and an operative field was created on the abdomen. All incisions were preemptively anesthetized with 0.25% bupivacaine with 1 200,000 epinephrine. A 5 mm transverse incision was made at the supraumbilical laparoscopy scar. A Verress needle was inserted and its correct location was verified. Pneumoperitoneum was created with carbon dioxide. A trocar was inserted and a 5 mm 30 laparoscope was introduced. Video laparoscopy was started with the patient in Trendelenburg position. 2 more 5 mm transverse incisions were made at the previous laparoscopic scars on either side of the umbilicus. Corresponding trochars were introduced. Next we made a 3 cm suprapubic transverse incision for the GelPort. After dissection of the anatomic layers, the peritoneal cavity was entered. A GelPort was placed. This access port was used sometimes is a laparoscopic port and sometimes as a minilaparotomy port for the rest of the procedure. A dilute vasopressin (0.4 units per mL) was injected into the uterus x 30 mL. A needle tip electrode at a setting of 97 W of cutting was used to make a vertical midline incision on the myometrium overlying the large fundal myoma. Some of the dissection was started laparoscopically and then the GelPort was opened and the rest of the suction was carried out by blunt and sharp dissection through this access point. Caudad to the dissection around the myoma, endometrial cavity was entered. The myoma was in situ morcellated and removed in its entirety through the [place]. Surrounding small myomas 2 were also enucleated and removed. Chromotubation define the endometrial cavity had been entered but the right tube did not fill. The endometrium was approximated with 4-0 Vicryl continuous  suture. The rest of the myometrium was closed in 2 layers with 2-0  Vicryl. The first layer was a continuous interlocking suture second layer was a continuous suture. An additional serosal layer of 4-0 Vicryl was used at the end. Hemostasis was insured. Pelvis was copiously irrigated and aspirated. A slurry of 2 sheets of Seprafilm and 60 mL of saline was instilled into the pelvis as an adhesion barrier.  The fascia was closed with 2-0 Vicryl continuous suture. Subcutaneous fatty layer was irrigated and hemostasis was checked. The skin was approximated with 4-0 Monocryl subcuticular suture. The laparoscopic ports were approximated with Dermabond. The patient tolerated the procedure well and was transferred to recovery in satisfactory condition.  SPECIAL NOTE: Because of the extent of the myometrial incision, future pregnancy should be delivered via cesarean section.  Governor Specking

## 2014-04-18 NOTE — Discharge Instructions (Addendum)
Myomectomy, Care After Refer to this sheet in the next few weeks. These instructions provide you with information on caring for yourself after your procedure. Your health care provider may also give you more specific instructions. Your treatment has been planned according to current medical practices, but problems sometimes occur. Call your health care provider if you have any problems or questions after your procedure. WHAT TO EXPECT AFTER THE PROCEDURE After your procedure, it is typical to have the following:  Pain in your abdomen, especially at any incision sites. You will be given pain medicine to control the pain.  Tiredness. This is a normal part of the recovery process. Your energy level will return to normal over the next several weeks.  Constipation.  Vaginal bleeding. This is normal and should stop after 1-2 weeks. HOME CARE INSTRUCTIONS   Only take over-the-counter or prescription medicines as directed by your health care provider. Avoid aspirin because it can cause bleeding.  Do not douche, use tampons, or have sexual intercourse until given permission by your health care provider.  Remove or change any bandages (dressings) as directed by your health care provider.  Take showers instead of baths as directed by your health care provider.  You will probably be able to go back to your normal routine after a few days. Do not do anything that requires extra effort until your health care provider says it is okay. Do not lift anything heavier than 15 pounds (6.8 kg) until your health care provider approves.  Walk daily but take frequent rest breaks if you tire easily.  Continue to practice deep breathing and coughing. If it hurts to cough, try holding a pillow against your belly as you cough.  If you become constipated, you may:  Use a mild laxative if your health care provider approves.  Add more fruit and bran to your diet.  Drink enough fluids to keep your urine clear or  pale yellow.  Take your temperature twice a day and write it down.  Do not drink alcohol.  Do not drive until your health care provider approves.  Have someone help you at home for 1 week or until you can do your own household activities.  Follow up with your health care provider as directed. SEEK MEDICAL CARE IF:  You have a fever.  You have increasing abdominal pain that is not relieved with medicine.  You have nausea, vomiting, or diarrhea.  You have pain when you urinate, or you have blood in your urine.  You have a rash on your body.  You have pain or redness where your IV access tube was inserted.  You have redness, swelling, or any kind of drainage from an incision. SEEK IMMEDIATE MEDICAL CARE IF:   You have weakness or lightheadedness.  You have pain, swelling, or redness in your legs.  You have chest pain.  You faint.  You have shortness of breath.  You have heavy vaginal bleeding.  Your incision is opening up. Document Released: 08/06/2010 Document Revised: 01/04/2013 Document Reviewed: 10/26/2012 [employer] Patient Information 2015 [place], [place]. This information is not intended to replace advice given to you by your health care provider. Make sure you discuss any questions you have with your health care provider.       Post Anesthesia Home Care Instructions  Activity: Get plenty of rest for the remainder of the day. A responsible adult should stay with you for 24 hours following the procedure.  For the next 24 hours, DO NOT: -  Drive a car -Paediatric nurse -Drink alcoholic beverages -Take any medication unless instructed by your physician -Make any legal decisions or sign important papers.  Meals: Start with liquid foods such as gelatin or soup. Progress to regular foods as tolerated. Avoid greasy, spicy, heavy foods. If nausea and/or vomiting occur, drink only clear liquids until the nausea and/or vomiting subsides. Call your physician if  vomiting continues.  Special Instructions/Symptoms: Your throat may feel dry or sore from the anesthesia or the breathing tube placed in your throat during surgery. If this causes discomfort, gargle with warm salt water. The discomfort should disappear within 24 hours.

## 2014-04-18 NOTE — Transfer of Care (Signed)
  Immediate Anesthesia Transfer of Care Note  Patient: Angel Bailey  Procedure(s) Performed: Procedure(s) (LRB): LAPAROSCOPIC GELPORT ASSISTED MYOMECTOMY, CHROMOTUBATION (N/A)  Patient Location: PACU  Anesthesia Type: General  Level of Consciousness: awake, sedated, patient cooperative and responds to stimulation  Airway & Oxygen Therapy: Patient Spontanous Breathing and Patient connected to face mask oxygen  Post-op Assessment: Report given to PACU RN, Post -op Vital signs reviewed and stable and Patient moving all extremities  Post vital signs: Reviewed and stable  Complications: No apparent anesthesia complications

## 2014-04-18 NOTE — H&P (Signed)
Angel Bailey is a 49 y.o. female , originally referred to me by Dr. Edwinna Areola, for Myomectomy. By [facility] patient was found to have an anteverted uterus, distorted D/T fundal fibroid. Fibroids x 3, Largest @ 64MM located at uterine fundus. Patient would like to preserve her childbearing potential.  Pertinent Gynecological History: Menses: flow is excessive with use of 3 pads or tampons on heaviest days Bleeding: dysfunctional uterine bleeding Contraception: none DES exposure: denies Blood transfusions: none Sexually transmitted diseases: no past history Previous GYN Procedures: L Salpingo-Oophorectomy  Last mammogram: normal Last pap: normal  OB History: G 0   Menstrual History: Menarche age: 31 No LMP recorded.    Past Medical History  Diagnosis Date  . Hypertension   . History of gonorrhea     2008  . History of kidney stones   . H/O Fitz-Hugh-Curtis syndrome     ABDOMINAL ADHESIONS  . History of ovarian cyst   . Uterine fibroid   . Leiomyoma of uterus   . History of HPV infection   . History of herpes simplex type 2 infection                     Past Surgical History  Procedure Laterality Date  . Robotic assisted diagnostic laparoscopy  12-31-2009    W/  LEFT SALPINGOOPHORECTOMY/  LEFT URETEROLYSIS/  EXTENSIVE LYSIS ADHESIONS  . Cystoscopy w/ ureteral stent placement  12/ 2010  . Cysto/  ureteroscopy laser lithotripsy  stone extraction  04-18-2009  . Tonsillectomy  as child             Family History  Problem Relation Age of Onset  . Hypertension Mother   . Diabetes Mother   . Hypertension Father   . Hypertension Maternal Grandmother    No hereditary disease.  No cancer of breast, ovary, uterus. No cutaneous leiomyomatosis or renal cell carcinoma.  History   Social History  . Marital Status: Single    Spouse Name: N/A    Number of Children: N/A  . Years of Education: N/A   Occupational History  . Not on file.   Social History Main Topics  .  Smoking status: Never Smoker   . Smokeless tobacco: Never Used  . Alcohol Use: Yes     Comment: occasional  . Drug Use: No  . Sexual Activity: Not on file   Other Topics Concern  . Not on file   Social History Narrative    Allergies  Allergen Reactions  . Penicillins Swelling    No current facility-administered medications on file prior to encounter.   Current Outpatient Prescriptions on File Prior to Encounter  Medication Sig Dispense Refill  . Iron-FA-B Cmp-C-Biot-Probiotic (FUSION PLUS) CAPS Take 1 capsule by mouth 1 day or 1 dose. (Patient taking differently: Take 1 capsule by mouth every morning. ) 30 capsule 11  . valsartan-hydrochlorothiazide (DIOVAN-HCT) 160-25 MG per tablet Take 1 tablet by mouth every morning.        Review of Systems  Constitutional: Negative.   HENT: Negative.   Eyes: Negative.   Respiratory: Negative.   Cardiovascular: Negative.   Gastrointestinal: Negative.   Genitourinary: Negative.   Musculoskeletal: Negative.   Skin: Negative.   Neurological: Negative.   Endo/Heme/Allergies: Negative.   Psychiatric/Behavioral: Negative.      Physical Exam  BP 134/88 mmHg  Pulse 72  Temp(Src) 97.7 F (36.5 C) (Oral)  Resp 16  Ht 5\' 2"  (1.575 m)  Wt 96.163 kg (212 lb)  BMI 38.77 kg/m2  SpO2 98%  LMP 03/21/2014 (Approximate) Constitutional: She is oriented to person, place, and time. She appears well-developed and well-nourished.  HENT:  Head: Normocephalic and atraumatic.  Nose: Nose normal.  Mouth/Throat: Oropharynx is clear and moist. No oropharyngeal exudate.  Eyes: Conjunctivae normal and EOM are normal. Pupils are equal, round, and reactive to light. No scleral icterus.  Neck: Normal range of motion. Neck supple. No tracheal deviation present. No thyromegaly present.  Cardiovascular: Normal rate.   Respiratory: Effort normal and breath sounds normal.  GI: Soft. Bowel sounds are normal. She exhibits no distension and no mass. There is  no tenderness.  Lymphadenopathy:    She has no cervical adenopathy.  Neurological: She is alert and oriented to person, place, and time. She has normal reflexes.  Skin: Skin is warm.  Psychiatric: She has a normal mood and affect. Her behavior is normal. Judgment and thought content normal.       Assessment/Plan:  Uterine myomas, causing menorrhagia and pressure sensation. Pt requests Laparoscopic Myomectomy.

## 2014-04-18 NOTE — Anesthesia Procedure Notes (Addendum)
Procedure Name: Intubation Date/Time: 04/18/2014 11:14 AM Performed by: Justice Rocher Pre-anesthesia Checklist: Patient identified, Emergency Drugs available, Suction available and Patient being monitored Patient Re-evaluated:Patient Re-evaluated prior to inductionOxygen Delivery Method: Circle System Utilized Preoxygenation: Pre-oxygenation with 100% oxygen Intubation Type: IV induction Ventilation: Mask ventilation without difficulty Laryngoscope Size: Mac and 4 Grade View: Grade II Tube type: Oral Tube size: 7.0 mm Number of attempts: 1 Airway Equipment and Method: Stylet and Oral airway Placement Confirmation: ETT inserted through vocal cords under direct vision,  positive ETCO2 and breath sounds checked- equal and bilateral Secured at: 22 cm Tube secured with: Tape Dental Injury: Teeth and Oropharynx as per pre-operative assessment

## 2014-04-25 HISTORY — PX: MYOMECTOMY: SHX85

## 2014-07-03 ENCOUNTER — Other Ambulatory Visit: Payer: Self-pay | Admitting: Nurse Practitioner

## 2014-07-03 ENCOUNTER — Telehealth: Payer: Self-pay | Admitting: Nurse Practitioner

## 2014-07-03 MED ORDER — VALACYCLOVIR HCL 500 MG PO TABS: ORAL_TABLET | ORAL | Status: DC

## 2014-07-03 NOTE — Telephone Encounter (Signed)
Order is placed.

## 2014-07-03 NOTE — Telephone Encounter (Signed)
Patient is requesting a new prescription, says she discussed two prescriptions at her last appointment. Patient cannot remember the name. Last seen 02/08/14. Patient will give pharmacy info when her call is returned.

## 2014-07-03 NOTE — Telephone Encounter (Signed)
Spoke with patient. Patient states that since she was last seen on 02/08/2014 for annual she has been having HSV outbreaks once per month. "They usually come right after my period. I was reading about suppression for the outbreaks. Could I try this? We talking about medicine before but I don't know if this is something I could try." Advised patient will speak with Milford Cage, FNP regarding treatment and return call. Patient is agreeable. Would like rx sent to [facility] in [place].   Milford Cage, FNP okay for patient to start on Valtrex 500mg  once per day?

## 2014-07-03 NOTE — Telephone Encounter (Signed)
Spoke with patient. Advised patient prescription has been placed for Valtrex 500mg  daily, take two tablets daily for 3-5 days with flare up. Patient is agreeable.  Routing to provider for final review. Patient agreeable to disposition. Will close encounter

## 2014-09-06 ENCOUNTER — Telehealth: Payer: Self-pay | Admitting: Nurse Practitioner

## 2014-09-06 NOTE — Telephone Encounter (Signed)
Spoke with patient. Patient states that she started her cycle on 5/27 and has been bleeding since. Patient had a myomectomy on 04/18/2014 with Dr.Yalcinkaya. Cycle in February, March, and April were all heavier than normal. Patient states she was having to change pad every hour. This cycle has been "a normal flow. It is not heavy and not light." Has been experiencing lower back pain and mild cramping. Denies light headedness or fatigue. States "I just feel blah. I can't tell what it is from." Not currently on any for of OCP. Advised patient will need to be seen in office fir further evaluation. Patient is agreeable. Appointment scheduled for tomorrow 6/10 at 1:45pm with Dr.Miller. Patient is agreeable.  Routing to provider for final review. Patient agreeable to disposition. Will close encounter.   Patient aware provider will review message and nurse will return call if any additional advice or change of disposition.

## 2014-09-06 NOTE — Telephone Encounter (Signed)
Left message to call Derenda Giddings at [phone]. 

## 2014-09-06 NOTE — Telephone Encounter (Signed)
Patient states she's been on her period for 2 weeks. Patient ok for call back ASAP

## 2014-09-06 NOTE — Telephone Encounter (Signed)
Pt returned call

## 2014-09-07 ENCOUNTER — Ambulatory Visit (INDEPENDENT_AMBULATORY_CARE_PROVIDER_SITE_OTHER): Payer: BC Managed Care – PPO | Admitting: Obstetrics & Gynecology

## 2014-09-07 ENCOUNTER — Encounter: Payer: Self-pay | Admitting: Obstetrics & Gynecology

## 2014-09-07 VITALS — BP 132/80 | HR 64 | Resp 16 | Wt 223.0 lb

## 2014-09-07 DIAGNOSIS — N921 Excessive and frequent menstruation with irregular cycle: Secondary | ICD-10-CM | POA: Diagnosis not present

## 2014-09-07 DIAGNOSIS — N938 Other specified abnormal uterine and vaginal bleeding: Secondary | ICD-10-CM | POA: Diagnosis not present

## 2014-09-07 DIAGNOSIS — D500 Iron deficiency anemia secondary to blood loss (chronic): Secondary | ICD-10-CM

## 2014-09-07 DIAGNOSIS — D251 Intramural leiomyoma of uterus: Secondary | ICD-10-CM | POA: Diagnosis not present

## 2014-09-07 DIAGNOSIS — N898 Other specified noninflammatory disorders of vagina: Secondary | ICD-10-CM | POA: Diagnosis not present

## 2014-09-07 LAB — CBC
HEMATOCRIT: 32.3 % — AB (ref 36.0–46.0)
Hemoglobin: 10 g/dL — ABNORMAL LOW (ref 12.0–15.0)
MCH: 23.6 pg — ABNORMAL LOW (ref 26.0–34.0)
MCHC: 31 g/dL (ref 30.0–36.0)
MCV: 76.2 fL — AB (ref 78.0–100.0)
MPV: 7.8 fL — ABNORMAL LOW (ref 8.6–12.4)
Platelets: 518 10*3/uL — ABNORMAL HIGH (ref 150–400)
RBC: 4.24 MIL/uL (ref 3.87–5.11)
RDW: 16.1 % — AB (ref 11.5–15.5)
WBC: 5 10*3/uL (ref 4.0–10.5)

## 2014-09-07 MED ORDER — NORETHINDRONE ACETATE 5 MG PO TABS: ORAL_TABLET | ORAL | Status: DC

## 2014-09-07 NOTE — Progress Notes (Signed)
Subjective:     Patient ID: Angel Bailey, female   DOB: [birth year], 49 y.o.   MRN: [id number]  HPI 49 yo G0 SAAF here for complaint of bleeding for almost six weeks.  Pt has lapaorsocpic myomectomy done 04/18/14 with Dr. Kerin Perna.  Reports first three cycles after surgery were very heavy and each lasted 7 days.  She did have clotting as well.  Cramping was minimal.  Then the cycle that started 08/21/14 started out differently, light but steady.  Pt reports after about a week, it became heavier with clots, and then finally seemed to lessen after about another 7 days.  It is still present but much lighter.  Denies cramps.  Pt has seen some clots but not large.  This is typically with the heavier bleeding.    Reports she does not feel like she has anemia.  Doesn't feel weak or lightheaded.  She is not have any palpitations or SOB.  She feels her energy is ok.  She is just tired of the bleeding.  Also, pt complains of vaginal discharge.  States, "I've been dealing with a yeast infection for over a year and I"m sick of it".  Feels like she has discharge all of the time and occasional itching.  No odor.  Not SA.  Mother accompanies her today.  Review of Systems  All other systems reviewed and are negative.      Objective:   Physical Exam  Constitutional: She appears well-developed and well-nourished.  Cardiovascular: Normal rate and regular rhythm.   Abdominal: Soft. Bowel sounds are normal.  Genitourinary: Uterus normal. There is no rash, tenderness or lesion on the right labia. There is no rash, tenderness or lesion on the left labia. Uterus is not enlarged (feels much smaller than prior to myomectomy) and not tender. Cervix exhibits no motion tenderness. Right adnexum displays no mass and no tenderness. Left adnexum displays no mass and no tenderness. There is bleeding (but very light) in the vagina.  Lymphadenopathy:       Right: No inguinal adenopathy present.       Left: No inguinal  adenopathy present.  Neurological: She is alert.  Skin: Skin is warm and dry.  Psychiatric: She has a normal mood and affect.       Assessment:     DUB with menorrhagia at times H/o myomectomy 1/16 with Dr. Kerin Perna H/O anemia with hb as low at in the 6 range in the past  Vaginal discharge Not sexually active    Plan:     CBC Aygestin 5mg  bid until bleeding stopped x 3 days, then decrease to daily.  OK to take for three to four weeks to "get a break" from the bleeding Consider IUD use.  Information provided.  Will contact pt again next week to discuss options.  I would proceed with IUD placement under ultrasound guidance due to myomectomy. Affirm obtained.  Lengthy visit with pt and her mother.  Many questions about her surgery, subsequent bleeding, treatment options discussed.  In total, 30 minutes spent with pt >50% in face to face discussion.

## 2014-09-08 LAB — WET PREP BY MOLECULAR PROBE
Candida species: NEGATIVE
Gardnerella vaginalis: NEGATIVE
Trichomonas vaginosis: NEGATIVE

## 2014-09-14 ENCOUNTER — Telehealth: Payer: Self-pay

## 2014-09-14 DIAGNOSIS — N938 Other specified abnormal uterine and vaginal bleeding: Secondary | ICD-10-CM

## 2014-09-14 NOTE — Telephone Encounter (Signed)
Spoke with patient. Advised of results as seen below from [place]. Patient is agreeable. "I am still having this milky, white discharge." Again advised all testing for yeast and bacteria vaginosis are negative. Is currently taking Aygestin 5 mg once per day. States she is still having intermittent spotting throughout the day. "I was surprised that I am still having some bleeding." Advised I will provide Dr.Miller with an update and return call with further recommendations. Patient is agreeable.

## 2014-09-14 NOTE — Telephone Encounter (Signed)
Spoke with patient. Advised of message as seen below from [place]. Patient is agreeable. PUS scheduled for 09/18/2014 at 4pm with 4:30pm consult with Dr.Miller. Patient is agreeable to date and time. Order placed for precert.  Cc: Theresia Lo  Routing to provider for final review. Patient agreeable to disposition. Will close encounter.

## 2014-09-14 NOTE — Telephone Encounter (Signed)
-----   Message from Megan Salon, MD sent at 09/13/2014  6:31 AM EDT ----- Inform pt that her affirm testing was negative for yeast.  So, the symptoms she is calling "yeast" is not the correct diagnosis.  He hb was 10.  She is on iron.  She was started on Aygestin.  How has she done with that and is she bleeding at all now?

## 2014-09-14 NOTE — Telephone Encounter (Signed)
I think she needs to come for an ultrasound.  I am surprised she is still bleeding.

## 2014-09-17 ENCOUNTER — Telehealth: Payer: Self-pay | Admitting: Obstetrics & Gynecology

## 2014-09-17 NOTE — Telephone Encounter (Signed)
Pt states changes in her bleeding. Pt has ultrasound scheduled 09/18/14 4pm. States Saturday and Sunday her bleeding was heavier and darker in color and intermittent in flow (heavier then spotting then heavier). Pt states she took medications later than normal and is unsure if that could have caused this issue. Is still bleeding this morning. Chart to triage

## 2014-09-17 NOTE — Telephone Encounter (Signed)
Spoke with patient. Patient states that she has been taking Aygestin 5 mg every morning at 6:30am. On Saturday patient did not take her Aygestin 5 mg until 4:30pm. When she went to the restroom on Saturday she noticed light "dark brown/black" bleeding. Bleeding is intermittent. Changes pad once per day. Woke up with mild lower back pain that has now subsided. "I will randomly have a gush of blood. I do not know what is going on." Advised with Aygestin it is important to take it as close to the same time daily as possible. Advised can cause some irregular bleeding if taken at different times. Patient is agreeable. Advised to continue with Aygestin 5 mg daily at the same time. Will need to monitor bleeding and if bleeding increases to having to change pad/tampon every hour due to soaking through will need to be seen immediately. Will need to keep PUS appointment scheduled for tomorrow 6/21 at 4pm with Dr.Miller. Patient is agreeable. Advised will provide Dr.Miller with an update and give patient a call back with any further recommendations. Patient is agreeable.  Dr.Miller, anything further needed for this patient?

## 2014-09-17 NOTE — Telephone Encounter (Signed)
Spoke with patient. Advised of message as seen below from [place]. Patient is agreeable and verbalizes understanding.  Will close encounter.

## 2014-09-17 NOTE — Telephone Encounter (Signed)
No new recommendations.  Will see her tomorrow for ultrasound.  Thanks.

## 2014-09-18 ENCOUNTER — Ambulatory Visit (INDEPENDENT_AMBULATORY_CARE_PROVIDER_SITE_OTHER): Payer: BC Managed Care – PPO | Admitting: Obstetrics & Gynecology

## 2014-09-18 ENCOUNTER — Ambulatory Visit (INDEPENDENT_AMBULATORY_CARE_PROVIDER_SITE_OTHER): Payer: BC Managed Care – PPO

## 2014-09-18 ENCOUNTER — Encounter: Payer: Self-pay | Admitting: Obstetrics & Gynecology

## 2014-09-18 VITALS — BP 120/78 | HR 88 | Resp 16 | Wt 222.0 lb

## 2014-09-18 DIAGNOSIS — D500 Iron deficiency anemia secondary to blood loss (chronic): Secondary | ICD-10-CM

## 2014-09-18 DIAGNOSIS — D251 Intramural leiomyoma of uterus: Secondary | ICD-10-CM | POA: Diagnosis not present

## 2014-09-18 DIAGNOSIS — N938 Other specified abnormal uterine and vaginal bleeding: Secondary | ICD-10-CM

## 2014-09-18 DIAGNOSIS — N921 Excessive and frequent menstruation with irregular cycle: Secondary | ICD-10-CM

## 2014-09-18 NOTE — Progress Notes (Signed)
49 y.o. G0 Singlefemale here for a pelvic ultrasound due to continued DUB after having a myomectomy with Dr. Kerin Perna 04/18/14.  Pt has failed micronor use in the past.  She has been trying to save her uterus for possible child bearing.  Pt not really interested in other treatment options at this time.  Patient's last menstrual period was 08/24/2014.  Sexually active:  no  Contraception: abstinence  FINDINGS: UTERUS: 9.7 x 8.6 x 6.6cm with multiple fibroids 4.0cm, 2.6cm, 2.0cm, 2.0cm, 2.0cm, 3.5cm, 2.0cm, 1.0cm, and 1.3cm.  Largest fibroid is centrally located. EMS: 4.46mm ADNEXA:   Left ovary surgically absent   Right ovary 2.9 x 2.3 x 2.1cm CUL DE SAC: no free fluid  Findings discussed with pt.  Considering the large, centrally located fibroid, I can understand her bleeding issues.  She is really not ready to discuss much in the way of treatment options, she is just so upset by the fact there is a larger fibroid present.  Questions whether it was there pre-operatively or not.  I do have a comparison but do not have any pre-operative evaluation done prior to surgery in january.  Pt really wants to know if this larger fibroid was present and states she doesn't think another ultrasound was done.  This would be atypical, in my opinion, for Dr. Kerin Perna.  Pt would really like to know answer, although this doesn't change current findings and/or recommendations.  Still, she feels this is very important.  Will have release signed and proceed with recommendations after pt has had some time to process.  We did review options, briefly, including OCPs, progesterone options, IUD, [place], repeat myomectomy, and hysterectomy.  She is leaning towards hysterectomy but, again, just not ready to really discuss this today.  Assessment:  DUB, anemia, uterine fibroid, h/o myomectomy  Plan: Release of records signed today.  Will see if any pre-op ultrasound(s) were performed before January surgery.  Pt highly encouraged  to consider options as we will probably need to make some decision moving forward.  Pt encouraged to restart her iron.  ~Lenghty visit with pt today regarding findings partly due to her frustration with persistent/recurrent fibroids and the number of procedures she has undergone to try and keep her uterus.  Pt very frustrate today.  In total, almost 35 minutes spent with pt, all of this in face to face discussion of above.

## 2014-09-28 ENCOUNTER — Telehealth: Payer: Self-pay | Admitting: Nurse Practitioner

## 2014-09-28 NOTE — Telephone Encounter (Signed)
Patient was last seen 09/18/14. Patient is wondering what follow up she needs.

## 2014-09-28 NOTE — Telephone Encounter (Signed)
Routing to [place] for review and advise. Patient was seen 09/18/2014 for PUS appointment.

## 2014-10-04 NOTE — Telephone Encounter (Signed)
Spoke with patient. Patient states "I am still having bleeding and it is not getting better with this medicine." Currently taking Aygestin 5mg  once per day.Patient was last seen on 09/18/2014 with Dr.Miller for PUS for DUB after myomectomy. Patient states she has thought about her options and is really only considering a hysterectomy. Patient calling to check to see what Dr.Miller and Dr.Yalcinkaya recommend. Also asking what she can do in the interim to stop the bleeding until she makes a decision. Advised Dr.Miller has been out of town and I will speak with her today. Will return call with further recommendations. Patient is agreeable.

## 2014-10-04 NOTE — Telephone Encounter (Signed)
Spoke with patient. Advised of message as seen below from [place]. Patient is agreeable and would like to schedule consult appointment at this time. Appointment scheduled for 10/12/2014 at 10am with Dr.Miller. Patient is agreeable to date and time.  Routing to provider for final review. Patient agreeable to disposition. Will close encounter.

## 2014-10-04 NOTE — Telephone Encounter (Signed)
Please let pt know I got Dr. Charlett Lango notes and I do not see any ultrasound pictures in the notes.  So, we discussed hysterectomy vs. Repeat myomectomy vs [place].  She may need an appt with her mother to discuss options/plans.

## 2014-10-12 ENCOUNTER — Ambulatory Visit (INDEPENDENT_AMBULATORY_CARE_PROVIDER_SITE_OTHER): Payer: BC Managed Care – PPO | Admitting: Obstetrics & Gynecology

## 2014-10-12 VITALS — BP 124/78 | HR 60 | Resp 16 | Ht 62.0 in | Wt 223.0 lb

## 2014-10-12 DIAGNOSIS — N938 Other specified abnormal uterine and vaginal bleeding: Secondary | ICD-10-CM

## 2014-10-12 DIAGNOSIS — D251 Intramural leiomyoma of uterus: Secondary | ICD-10-CM | POA: Diagnosis not present

## 2014-10-13 ENCOUNTER — Encounter: Payer: Self-pay | Admitting: Obstetrics & Gynecology

## 2014-10-13 NOTE — Progress Notes (Signed)
Patient ID: Angel Bailey, female   DOB: 09-22-1965, 49 y.o.   MRN: [id number]   49 yo G0 SAAF here to discuss options regarding bleeding.  Pt with hx of menorrhagia and uterine fibroids.  Pt underwent myomectomy in 1/16 with Dr. Kerin Perna due to 8cm fibroid.  Since surgery, pt's bleeding has actually worsened.  Pt did have a PUS on 09/18/14 showing uterus 9.7 x 8.6 x 6.6cm with a 4cm central fibroid that is distorting the endometrial cavity but is submucosal in appearance.  In addition, there are several small fibroids.  In total, she has at least nine fibroids.  This is a significant change in the number of fibroids since the last time she has undergone a pelvis ultrasound in my office.  Last one was 11/14 showing a 6cm, 3cm (which is the one that now appear to be 4cm), and a 1.8cm fibroid.  The largest one was removed with the myomectomy and now it appears the central fibroid has enlarged and there are now several "new" small fibroids.    Pt has been bleeding since May but really has had almost no dry days since the surgery in January.  Pt's biggest issue with making decisions about treatment options is that she does not want to do anything that would interfere with having children.  Marriage and children have been her biggest life desire.  She is not dating anyone at this time.  Did discuss with Dr. Kerin Perna ([facility]) donor egg and donor sperm.  Decided she did not want to do this on her own.  He has informed her 62 is his cut-off for age for doing any treatments.  She will be 49 in November.  Pt is therefore not interested in anything that will prevent future child bearing.  Not interested in hysterectomy, uterine artery embolization, although we reviewed these today.  I do not think she is a good candidate for endometrial ablation due to her prior myomectomy but she isn't interested in this either due to no future childbearing after procedure.  She does not want to undergo another myomectomy.  I,  personally, feel this would need to be done laparoscopically and not hysteroscopically.  Her surgery in [place] was a much harder recovery than she expected.  This is also contributing to her decision about not proceeding with myomectomy again.  Pt did have a laparoscopic LSO 2011 (that I performed) due to complex adnexal mass that turned out to be endometrioma/endometriosis with significant endometriosis on this size with adhesions around the ovary.  Pt did very well with this surgery and recovery.    Pt is currently on aygestin to help with bleeding control.  She does have anemia with hb around 10 now.  She is on iron.  Pt has been on micronor in the past and hated the side effects.  Declines using this again.  Pt has hx of hypertension so I feel uncomfortable with this and her age and using combination OCPs.  Depo Lupron also discussed.  Pt not sure about the hormonal side effects but will do some research.  So, she feels between a rock and a hard place--basically that she should do a hysterectomy but this eliminates any future child bearing possibility.  Weeps in the office when she says this aloud.  Her mother is with her today (as she often is) and is very supportive.  Pt states she feels she just needs someone else to tell her this and/or review options.  I am happy  to refer her to the minimally invasive clinic/fibroid clinic at [facility] for another opinion.  She would very much like this.  I, personally, feel her options really are repeat myomectomy, [place], hysterectomy, and short term depo lupron. She will research and is grateful for being able to discuss this with an expert at specialty clinic.  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

## 2014-10-17 ENCOUNTER — Telehealth: Payer: Self-pay | Admitting: Obstetrics & Gynecology

## 2014-10-17 NOTE — Telephone Encounter (Addendum)
Angel Bailey at [facility] minimally invasive surgical gyn clinic calling with questions regarding ultrasound records that were sent to them. Angel Bailey needs help interpreting the results. (unable to read due to poor image quality) Ph [phone]

## 2014-10-17 NOTE — Telephone Encounter (Signed)
Spoke with Neoma Laming at [facility] minimally invasive surgical gyn clinic. Neoma Laming states ultrasound from 09/18/2014 was faxed over and difficult to read. Ultrasound reviewed with Neoma Laming and all measurements provided as seen below. Neoma Laming is agreeable and verbalizes understanding.  FINDINGS: UTERUS: 9.7 x 8.6 x 6.6cm with multiple fibroids 4.0cm, 2.6cm, 2.0cm, 2.0cm, 2.0cm, 3.5cm, 2.0cm, 1.0cm, and 1.3cm. Largest fibroid is centrally located. EMS: 4.41mm ADNEXA:  Left ovary surgically absent Right ovary 2.9 x 2.3 x 2.1cm CUL DE SAC: no free fluid  Routing to provider for final review. Patient agreeable to disposition. Will close encounter.   Patient aware provider will review message and nurse will return call if any additional advice or change of disposition.

## 2014-10-18 ENCOUNTER — Telehealth: Payer: Self-pay | Admitting: Obstetrics & Gynecology

## 2014-10-18 ENCOUNTER — Other Ambulatory Visit: Payer: Self-pay | Admitting: Obstetrics & Gynecology

## 2014-10-18 DIAGNOSIS — D250 Submucous leiomyoma of uterus: Secondary | ICD-10-CM

## 2014-10-18 NOTE — Telephone Encounter (Signed)
Thank you very much.  Ok to close encounter.

## 2014-10-18 NOTE — Telephone Encounter (Signed)
Angel Bailey is scheduled to see Dr Lanelle Bal for dysfunctional uterine bleeding and leiomyoma on 10/30/14 at the pelvic pain clinic with [facility] minimally invasive surgical.

## 2014-10-19 ENCOUNTER — Other Ambulatory Visit: Payer: Self-pay | Admitting: Nurse Practitioner

## 2014-10-19 MED ORDER — NORETHINDRONE ACETATE 5 MG PO TABS: ORAL_TABLET | ORAL | Status: DC

## 2014-10-19 NOTE — Telephone Encounter (Signed)
Patient  Is requesting a refill for NORETHINDRONE 5 MG TABLET she states her prescription was written for only one month. [facility] [place]

## 2014-10-19 NOTE — Telephone Encounter (Signed)
Medication refill request: Aygestin 5 mg Last AEX:  02/08/14 with PG Next AEX: 11/15 /16 with PG Last MMG (if hormonal medication request): 07/24/13 bi-rads 1: negative Refill authorized: #60? Please advise

## 2014-10-22 NOTE — Telephone Encounter (Signed)
LM on patient's vm that rx has been sent to pharmacy.

## 2014-11-07 ENCOUNTER — Telehealth: Payer: Self-pay | Admitting: Nurse Practitioner

## 2014-11-07 NOTE — Telephone Encounter (Addendum)
Spoke with patient. She is currently taking Aygestin 5 mg po daily. Reports she has been taking two tablets daily and was not having "as heavy bleeding as usual" and that when she takes two tablets per day, bleeding seems to "level off." Patient states she has been having at "least some bleeding every day since 08/24/14".   Since Sunday, 11/03/14, she has been taking one tablet daily because [facility] refill was placed to dispense 60 tablets on 10/19/14, she decreased to one tablet per day because she was afraid she would run out of her prescription and was unsure of what to do. Today, bleeding is dark red per patient. This RN unable to assess bleeding from patient, she is unable to tell me how often she is changing her tampon. She describes one bleeding episode this morning, as "an explosion" and "like a faucet" she reports she changed her tampon after the "explosion" and that bleeding has been minimal since "explosion." Patient reports she is not sexually active, has no concerns regarding pregnancy.  Denies abdominal pain or pelvic pain. Patient denies weakness, dizziness, but states she is fatigued and expresses that she "stays tired and tired of this situation."   Patient is offered appointment with Dr. Sabra Heck tomorrow at 1045 and she declines due to work schedule. She is offered appointment for Friday and she accepts this for 1245.    Advised patient to call back or seek immediate medical care if bleeding worsens or soaking through 1 pad/tampon per hour for two hours or if becomes symptomatic with sob, chest pain, fatigued, lightheaded, or weakness.  Patient verbalized understanding.   Patient states she has seen [facility] for second opinion and that "they basically gave me the same options as Dr. Sabra Heck did." She states she "hasn't made any final decisions and this makes me incredibly sad." Patient continues to express frustration at this time and states "if I continue to  bleed I may just throw myself down the stairs, I am so tired of it all." When questioned patient about this further, she confirms that she is not at risk to harm herself or anyone else, but continues to state she is "just sad."  Patient mentions she has discussed Depot Lupron with Dr. Sabra Heck and "I don't want to introduce another hormone into my body." She also states that "with my luck, if I have a hysterectomy I will continue to bleed."  Advised will obtain instructions from covering provider today, Dr. Quincy Simmonds and will return call with instructions.   Remainder of 30 tablets of Aygestin 5 mg transferred to [place] for patient to pick up tonight.  Confirmed with [facility] that [facility] for patient. She states prescription will be processed tonight for pick up at 6:30.   23 minutes on the phone with patient at this time.   Routing to Dr. Quincy Simmonds and Dr. Sabra Heck.

## 2014-11-07 NOTE — Telephone Encounter (Signed)
Patient states she has questions regarding her Progesterone medication.

## 2014-11-07 NOTE — Telephone Encounter (Signed)
Reviewed with Dr. Talbert Nan, okay as instructed. Aygestin BID until appointment with Dr. Sabra Heck. Bleeding precautions given.   Called patient and advised Aygestin 5 mg po bid until appointment with Dr. Sabra Heck on Friday and to return call as instructed previously if any concerns. Patient confirms again that she understands instructions and confirms safety, no thoughts of harm to herself or anyone else. Appointment for Friday confirmed.

## 2014-11-07 NOTE — Telephone Encounter (Signed)
I have reviewed these messages, and the patient has been appropriately advised.   Cc - Dr. Sabra Heck

## 2014-11-09 ENCOUNTER — Ambulatory Visit (INDEPENDENT_AMBULATORY_CARE_PROVIDER_SITE_OTHER): Payer: BC Managed Care – PPO | Admitting: Obstetrics & Gynecology

## 2014-11-09 ENCOUNTER — Telehealth: Payer: Self-pay | Admitting: *Deleted

## 2014-11-09 VITALS — BP 116/72 | HR 64 | Resp 20 | Wt 216.0 lb

## 2014-11-09 DIAGNOSIS — D500 Iron deficiency anemia secondary to blood loss (chronic): Secondary | ICD-10-CM | POA: Diagnosis not present

## 2014-11-09 DIAGNOSIS — N921 Excessive and frequent menstruation with irregular cycle: Secondary | ICD-10-CM | POA: Diagnosis not present

## 2014-11-09 DIAGNOSIS — D251 Intramural leiomyoma of uterus: Secondary | ICD-10-CM

## 2014-11-09 NOTE — Telephone Encounter (Signed)
Call to patient. Advised of process for Lupron precert. One sample dose has been ordered until prior auth and precert can be completed. Will contact patient upon sample arrival to schedule injection.  Routing to provider for final review. Patient agreeable to disposition. Will close encounter.

## 2014-11-12 ENCOUNTER — Encounter: Payer: Self-pay | Admitting: Obstetrics & Gynecology

## 2014-11-12 NOTE — Progress Notes (Signed)
49 y.o. Single African American female G0P0 here for follow up of DUB and uterine fibroids.  Pt recently seen at [facility] fibroid clinic/advanced laparoscopy clinic due to continued bleeding and anemia after having myomectomy in January of this year.  Pt was advised to have a hysterectomy or myomectomy and pt feels paralyzed about making a decision between either one.  She just doesn't want another surgery.  We have discuss Depo Lupron in the past.  Administration, side effects including change in bone health, increased bleeding for first week to two weeks with first administration, and/or hot flashes or other menopausal type symptoms all discussed.  She would like to proceed with this right now as she just doesn't want to "have" to make a decision.   Do not currently have any in the office but will start initiation of this through her insurance and will see if can get a sample to start her on dosing next week.  All questions answered.  O: Healthy WD,WN AA female Affect: normal  A:DUB after myomectomy in 1/16 with menorrhagia at times Uterine fibroids Anemia  P: Will begin dosage of DepoLupron 3.75mg  IM monthly for six months if well tolerated by the pt.  She does understand once dosage is given, it cannot be undone or reversed.  Pt voices clear understanding.  Will call once we know about getting office sample or get rx through mail order pharmacy.  Continue Aygestin 5mg  bid until DepoLupron given.  Then will switch to 5mg  daily.  ~15 minutes spent with patient >50% of time was in face to face discussion of above.

## 2014-11-16 ENCOUNTER — Telehealth: Payer: Self-pay | Admitting: *Deleted

## 2014-11-16 NOTE — Telephone Encounter (Signed)
Not SA.  Ok to give any day that works for pt to come.  Thanks.

## 2014-11-16 NOTE — Telephone Encounter (Signed)
Nurse visit scheduled for 11-22-14 per patient request. Encounter closed.

## 2014-11-16 NOTE — Telephone Encounter (Signed)
Sample does of Lupron Depot 3.75mg  received. Patient not currently sexually active and is taking Aygestin BID to control bleeding.  Patient states she is still bleeding despite Aygestin BID.  Patient has received call from speciality pharmacy regarding Lupron; right now patient is only planning on one dose of Lupron. Advised precert for 6 month course of therapy has been completed if needed.   Please confirm when to begin Lupron. Patient will be back in town on 11-22-14.

## 2014-11-21 ENCOUNTER — Telehealth: Payer: Self-pay | Admitting: Obstetrics & Gynecology

## 2014-11-21 NOTE — Telephone Encounter (Signed)
Spoke with patient. Patient states that she stopped bleeding a day and a half ago. Cancelled her Lupron injection appointment for 11/22/2014 as "I was told I needed to be bleeding for the appointment." Advised patient that typically Lupron is given with menses, but with her irregular bleeding her injection time frame may be different. Patient states she does not want to have the injection without having any bleeding. Has decreased her Aygestin to 5 mg daily instead of twice daily. Patient is requesting I speak with Dr.Miller regarding her recommendations before proceeding. Advised will speak with Dr.Miller upon her return to the office in the morning and return call. Patient is agreeable. States she will not be able to come in tomorrow if she needs to reschedule due to scheduling conflicts.

## 2014-11-21 NOTE — Telephone Encounter (Signed)
Patient called and cancelled her Lupron appointment for 11/22/14. She said, "I am not bleeding for the first time since April. I think I need to speak to the nurse because I am supposed to be bleeding when I get it."

## 2014-11-22 ENCOUNTER — Ambulatory Visit: Payer: BC Managed Care – PPO

## 2014-11-22 NOTE — Telephone Encounter (Signed)
Left message to call Kaitlyn at [phone]. 

## 2014-11-22 NOTE — Telephone Encounter (Signed)
Spoke with patient. Advised I have spoken with Dr.Miller. Advised she does not have to be bleeding to have Lupron injection. Offered to reschedule injection at this time. Patient declines. Would like to wait to see how she does over the weekend. "I am just so excited not to be bleeding." Advised with her history of irregular bleeding bleeding may return.  Patient is agreeable. Advised sample Lupron we have in office that was to be given will become available to another patient who may need the injection. Advised if she decides to proceed with Lupron injection will need to authorize shipment of injection to our office through specialty pharmacy. Patient is agreeable and verbalizes understanding.  Routing to [place] for review before closing.

## 2014-11-26 ENCOUNTER — Telehealth: Payer: Self-pay

## 2014-11-26 MED ORDER — NORETHINDRONE ACETATE 5 MG PO TABS: ORAL_TABLET | ORAL | Status: DC

## 2014-11-26 NOTE — Telephone Encounter (Signed)
Spoke with patient. Patient states that on Friday 11/23/2014 she began to have bleeding again. States bleeding has been steady and moderate. Patient would like to proceed with getting Lupron injection in our office. Advised will need to speak with our nursing supervisor here in the office to check on the status of the sample injection we had for her. Advised she will likely need to call to authorize shipment of Lupron injection to our office, but I will check and return call. Patient is agreeable.

## 2014-11-26 NOTE — Telephone Encounter (Signed)
Spoke with patient. Advised will need to contact [employer] to authorize shipment of Lupron to our office. Patient is agreeable and will call today. Advised they will then contact our office to schedule shipment date. Once this is set I will call her to set up a nurse appointment for Lupron injection. Patient is agreeable. States she will need a refill of her Aygestin 5 mg tablets as she only has 1 tablet left. Advised I will speak with Dr.Miller regarding refill and return call. Patient is agreeable. [facility] [place], [place] [facility] [address].  Dr.Miller, Faythe Ghee to refill patient's Aygestin 5 mg take 2 tablets daily #60 [id number]?

## 2014-11-26 NOTE — Telephone Encounter (Addendum)
Left message to call [place] at [phone].  Need to advised patient she will need to contact [employer] to authorize shipment of Lupron to our office. The phone number to [place] is [phone].

## 2014-11-26 NOTE — Telephone Encounter (Signed)
Spoke with [place] at time of incoming call. Authorization for Lupron has been completed by patient. Lupron will be delivered to our office on 11/28/2014. Spoke with patient. Nurse appointment scheduled for 11/29/2014 at 4 pm. Patient is agreeable to date and time. Advised I will speak with Dr.Miller regarding refills on Aygestin and return call. Patient is agreeable.

## 2014-11-26 NOTE — Telephone Encounter (Signed)
Rx for aygestin signed and sent to pharmacy.  She can continue as she has been taking.

## 2014-11-27 NOTE — Telephone Encounter (Signed)
Called patient and message from Dr. Sabra Heck given. She is agreeable. Will continue to take Aygestin 5 mg bid and follow up appointment as scheduled.  Routing to provider for final review. Patient agreeable to disposition. Will close encounter.

## 2014-11-29 ENCOUNTER — Ambulatory Visit (INDEPENDENT_AMBULATORY_CARE_PROVIDER_SITE_OTHER): Payer: BC Managed Care – PPO

## 2014-11-29 VITALS — BP 114/68 | HR 68 | Resp 14 | Wt 217.8 lb

## 2014-11-29 DIAGNOSIS — D251 Intramural leiomyoma of uterus: Secondary | ICD-10-CM

## 2014-11-29 LAB — POCT URINE PREGNANCY: Preg Test, Ur: NEGATIVE

## 2014-11-29 MED ORDER — LEUPROLIDE ACETATE 3.75 MG IM KIT
3.7500 mg | PACK | Freq: Once | INTRAMUSCULAR | Status: AC
  Administered 2014-11-29: 3.75 mg via INTRAMUSCULAR

## 2014-11-29 NOTE — Progress Notes (Signed)
Patient is here for Depo Lupron Injection. Urine pregancay was negative. Pocket with Depo Lupron information given to patient.  Pt tolerated Injection well.  Routed to provider for review, encounter closed.

## 2014-11-30 ENCOUNTER — Other Ambulatory Visit: Payer: Self-pay | Admitting: Obstetrics & Gynecology

## 2014-11-30 NOTE — Telephone Encounter (Signed)
Patient's  Aygestin medication was sent on 11/26/14 #60 to [facility].

## 2014-12-10 ENCOUNTER — Telehealth: Payer: Self-pay | Admitting: Obstetrics & Gynecology

## 2014-12-10 NOTE — Telephone Encounter (Signed)
Patient returned call. She states that her bleeding since 11/29/14 Lupron Depot injection has increased. She is wearing an Ultra Tampon and changing q 2 to 2.5 hours. Patient also reports fatigue, no dizziness, weakness or sob. She is driving to work at this time. Patient advised that with injection, bleeding may increase but then will level off and stop after approximately two to four weeks.  She denies pelvic pain or abdominal pain.   Patient declines office visit for evaluation. Declines any day offered. She states "I am just going to try to ride this out." Patient advised if not currently doing so to continue with Iron supplements with vitamin C source. Patient states she has a prescription Iron at home and will restart. Discussed possibility for anemia due to vaginal bleeding and reason for evaluation and patient continues to decline office visit.   We discussed bleeding emergencies and Advised patient to call back or seek immediate medical care if bleeding worsens or soaking through 1 pad/tampon per hour for two hours or if becomes symptomatic with sob, chest pain, fatigued, lightheaded, or weakness.  Patient verbalized understanding. She will call back as needed.   Advised will return call with any additional instructions from Dr. Sabra Heck.

## 2014-12-10 NOTE — Telephone Encounter (Signed)
Patient had Depot Lupron on 11/29/14.  Message left to return call to [place] at [phone].

## 2014-12-10 NOTE — Telephone Encounter (Signed)
Agree with instructions given.  Ok to close encounter. 

## 2014-12-10 NOTE — Telephone Encounter (Signed)
Patient has a question in regards to lupron injection. Best # to reach [phone], patient says she is driving and if someone happens to call her and you don't get patient then just leave a message and she will call back.

## 2014-12-21 ENCOUNTER — Telehealth: Payer: Self-pay | Admitting: Obstetrics & Gynecology

## 2014-12-21 NOTE — Telephone Encounter (Signed)
Reviewed with Dr. Sabra Heck. Advised patient to continue with Lupron injection #2.  Patient ready to proceed with surgery, she requests any time after 01/07/15.  Advised Dr. Sabra Heck will send her information to insurance coordinator and will return call to discuss coverage and planning of surgery.  Patient agreeable. Advised patient to call back or seek immediate medical care if bleeding worsens or soaking through 1 pad/tampon per hour for two hours or if becomes symptomatic with sob, chest pain, fatigued, lightheaded, or weakness.  Patient verbalized understanding.   [place] and spoke with Naaman Plummer, they will call patient to authorize shipment.

## 2014-12-21 NOTE — Telephone Encounter (Signed)
Routing to [place] for precert for robotic TLH/bilateral salpingectomy/cystoscopy.  DX:  Menorrhagia, DUB, Uterine fibroids, anemia

## 2014-12-21 NOTE — Telephone Encounter (Signed)
Spoke with patient. She received initial lupron injection on 11/29/14. She states bleeding has decreased, but still experiences daily bleeding and changes ultra plus tampon q 4 hours. Feeling fatigued, but taking iron.  Patient states she is ready to plan for surgery at this time. Wants to know if Dr. Sabra Heck recommends giving Lupron more time to work or can start planning for surgery.  Advised will send message to Dr. Sabra Heck for advice. Patient agreeable.

## 2014-12-21 NOTE — Telephone Encounter (Signed)
Patient called and said, "The Depo Lupron that was supposed to stop my bleeding is not working. I am still bleeding."

## 2014-12-26 ENCOUNTER — Telehealth: Payer: Self-pay | Admitting: Obstetrics & Gynecology

## 2014-12-26 NOTE — Telephone Encounter (Signed)
Patients benefits are verified. No precert required. Spoke with patient. Reviewed benefit information. Patient understood and agreeable. Answered all questions regarding benefit. Reviewed non-refundable deposit and payment schedule information. Patient understood and agreeable. Reviewed professional benefit only. Hospital will contact separately. Patient agreeable to move forward with payment and scheduling. Payment received. Surgery scheduling unavailable at time of call. Patient agreeable to return call to schedule. Routing to [place].

## 2014-12-26 NOTE — Telephone Encounter (Signed)
Message on answering machine: Caren Griffins at [facility] calling regarding patient's Depo Lupron prescription. Caren Griffins is asking to confirm delivery dates.

## 2014-12-26 NOTE — Telephone Encounter (Signed)
Call to patient. Robotic days with Dr. Sabra Heck discussed. She will call her Mother and return my call.

## 2014-12-26 NOTE — Telephone Encounter (Signed)
Call to patient for surgical instructions. Surgery is scheduled for 01/21/15 at 0730 arrive 90 minutes prior at 0600 unless directed by [facility] hospital pre-op nurse.   Beginning 10 days before surgery, we ask that you do not take the following medications:  Aspirin, Vitamin E, NSAIDS (like Advil, Aleve, Motrin, Ibuprofen), Fish Oil, Herbal supplements.  You can take Tylenol or acetaminophen for any discomfort.  Do not take any other pain medications unless approved by your physician.  You may continue your regular multi-vitamin. No bowel prep needed.  DO NOT EAT OR DRINK ANYTHING AFTER MIDNIGHT ON THE NIGHT BEFORE YOUR SURGERY (INCLUDING WATER) UNLESS ADVISED TO DO SO BY THE HOSPITAL OR YOUR PHYSICIAN.  Dress casually the morning of surgery.  Do not take any valuables with you and do not wear makeup, jewelry or lotion.  You should not shave 48 hours prior to surgery.  Pre op appointment scheduled for 01/11/15 at 0915  1 week post op appointment scheduled for 01/28/15 at 1:00 One Month post op scheduled for 02/18/15 at 1:00.   Letter mailed to patient home address with instructions and appointments.  Patient given number to call to request that Depo Lupron be shipped to our office.  Routing to provider for final review. Patient agreeable to disposition. Will close encounter.

## 2014-12-26 NOTE — Telephone Encounter (Signed)
We need to assure patient is not pregnant for her to receive the Depo Lupron.  Will need pregnancy test before receiving the injection.  OK to do serum HCG.  Please let me know her status so I can plan further.  I see she has surgery next month so we do not want to delay for the Depo Lupron.

## 2014-12-26 NOTE — Telephone Encounter (Signed)
This is Dr. Quincy Simmonds reviewing Dr. Ammie Ferrier in box.   Please confirm the procedure scheduled for the patient.  I want to be sure that the scheduling and precert match up.  She may receive her Depo Lupron on 9/29/ or 9/30 in office.  I can now see that she is already on Depo Lupron.   Thank you.

## 2014-12-26 NOTE — Telephone Encounter (Signed)
.   Called and requested delivery of Lupron.  Will arrive tomorrow after 1045.   Dr. Quincy Simmonds, please advise. Since this will be patient second injection of Lupron, okay to have any time?

## 2014-12-27 NOTE — Telephone Encounter (Signed)
Please see additional message from Dr. Quincy Simmonds that was copied from telephone encounter dated 12/21/14.

## 2014-12-27 NOTE — Telephone Encounter (Signed)
Routing to provider for final review. Patient agreeable to disposition. Will close encounter.     

## 2014-12-27 NOTE — Telephone Encounter (Signed)
Nunzio Cobbs, MD at 12/26/2014 11:49 PM    Status: Signed      Expand All Collapse All    This is Dr. Quincy Simmonds reviewing Dr. Ammie Ferrier in box.   Please confirm the procedure scheduled for the patient. I want to be sure that the scheduling and precert match up.  She may receive her Depo Lupron on 9/29/ or 9/30 in office. I can now see that she is already on Depo Lupron.   Thank you.

## 2014-12-27 NOTE — Telephone Encounter (Addendum)
Dr. Quincy Simmonds,  Patient is scheduled for robotic TLH/bilateral salpingectomy/cystoscopy on 01/21/15.  The surgery is posted as robotic assisted total laparoscopic hysterectomy with bilateral salpingectomy Cystoscopy.

## 2014-12-27 NOTE — Telephone Encounter (Signed)
Patient scheduled Lupron injection appointment 12/28/14 @ 3:30pm.

## 2014-12-27 NOTE — Telephone Encounter (Signed)
Call to patient to schedule Lupron injection. It has arrived to the office.  Patient states she will call back to schedule injection for tomorrow or Monday.

## 2014-12-28 ENCOUNTER — Ambulatory Visit (INDEPENDENT_AMBULATORY_CARE_PROVIDER_SITE_OTHER): Payer: BC Managed Care – PPO

## 2014-12-28 VITALS — BP 118/76 | HR 88 | Resp 16 | Ht 62.0 in | Wt 219.0 lb

## 2014-12-28 DIAGNOSIS — D251 Intramural leiomyoma of uterus: Secondary | ICD-10-CM | POA: Diagnosis not present

## 2014-12-28 MED ORDER — LEUPROLIDE ACETATE 3.75 MG IM KIT
3.7500 mg | PACK | Freq: Once | INTRAMUSCULAR | Status: AC
  Administered 2014-12-28: 3.75 mg via INTRAMUSCULAR

## 2014-12-28 MED ORDER — LEUPROLIDE ACETATE 3.75 MG IM KIT: 3.7500 mg | PACK | Freq: Once | INTRAMUSCULAR | Status: DC

## 2014-12-28 NOTE — Progress Notes (Signed)
Encounter reviewed by Dr. Brook Amundson C. Silva.  

## 2014-12-28 NOTE — Progress Notes (Signed)
Patient stopped by the office today to have second Lupron Injection administered.   Patient tolerated injection well.  Routed to Dalonte Hardage for review, encounter closed.

## 2015-01-10 ENCOUNTER — Telehealth: Payer: Self-pay | Admitting: Obstetrics & Gynecology

## 2015-01-10 NOTE — Telephone Encounter (Signed)
Tamika from [facility] pre-op orders in from Dr. Sabra Heck for patient.

## 2015-01-10 NOTE — Telephone Encounter (Signed)
Patient surgery scheduled for 01-21-15. Robotic TLH with salpingectomy and cystoscopy.  Office surgery consult scheduled for 01-11-15. Needs surgery orders please.  Routing to Dr Sabra Heck.

## 2015-01-11 ENCOUNTER — Ambulatory Visit (INDEPENDENT_AMBULATORY_CARE_PROVIDER_SITE_OTHER): Payer: BC Managed Care – PPO | Admitting: Obstetrics & Gynecology

## 2015-01-11 ENCOUNTER — Encounter: Payer: Self-pay | Admitting: Obstetrics & Gynecology

## 2015-01-11 VITALS — BP 125/90 | HR 68 | Resp 14 | Ht 62.0 in | Wt 221.0 lb

## 2015-01-11 DIAGNOSIS — D500 Iron deficiency anemia secondary to blood loss (chronic): Secondary | ICD-10-CM

## 2015-01-11 DIAGNOSIS — N921 Excessive and frequent menstruation with irregular cycle: Secondary | ICD-10-CM | POA: Diagnosis not present

## 2015-01-11 DIAGNOSIS — D251 Intramural leiomyoma of uterus: Secondary | ICD-10-CM

## 2015-01-11 MED ORDER — HYDROCODONE-ACETAMINOPHEN 5-325 MG PO TABS: 1.0000 | ORAL_TABLET | Freq: Four times a day (QID) | ORAL | Status: DC | PRN

## 2015-01-11 NOTE — Progress Notes (Signed)
49 y.o. G0P0 SingleAfrican American female here for discussion of upcoming procedure.  TAH/right salpingectomy/possible cystoscopy planned due to large fibroid uterus, nulliparity, h/o prior myomectomy, h/o menorrhagia, h/o chronic anemia, h/o DUB.  Pt has been hoping to have children but is not in a relationship at this time.  Underwent a myomectomy with Dr. Kerin Perna 1/16 and has spotted or bled almost daily since that time.  Ultrasound 6/16 showed 9 fibroid with 4cm fibroid that is distorting a thin endometrium.  She has failed progesterone only OCPs and myomectomy.  I do not feel she is a good condidate for combination OCPs due to her ago and hypertension.  As well, IUD use would be difficult due to distortion of endometrium from fibroid.  Lastly, she did receive on Depo Lupron injection.  He bleeding was much improved with this but she then decided to just proceed with definitive surgery.    Procedure discussed with patient.  Hospital stay, recovery and pain management all discussed.  Risks discussed including but not limited to bleeding, 1% risk of receiving a  transfusion, infection, 3-4% risk of bowel/bladder/ureteral/vascular injury discussed as well as possible need for additional surgery if injury does occur discussed.  DVT/PE and rare risk of death discussed.  My actual complications with prior surgeries discussed.  Vaginal cuff dehiscence discussed.  Hernia formation discussed.  Positioning and incision locations discussed.  Patient aware if pathology abnormal she may need additional treatment.  All questions answered.    Ob Hx:   LMP: 08/24/14        Sexually active: No. Birth control: abstinence Last pap: 02/08/14 Neg. HR HPV:neg Last MMG: 07/24/13 BIRADS1:neg Tobacco: No  Past Surgical History  Procedure Laterality Date  . Robotic assisted diagnostic laparoscopy  12-31-2009    W/  LEFT SALPINGOOPHORECTOMY/  LEFT URETEROLYSIS/  EXTENSIVE LYSIS ADHESIONS  . Cystoscopy w/ ureteral stent  placement  12/ 2010  . Cysto/  ureteroscopy laser lithotripsy  stone extraction  04-18-2009  . Tonsillectomy  as child  . Myomectomy  04-25-14    Past Medical History  Diagnosis Date  . Hypertension   . History of gonorrhea     2008  . History of kidney stones   . H/O Fitz-Hugh-Curtis syndrome     ABDOMINAL ADHESIONS  . History of ovarian cyst   . Leiomyoma of uterus   . History of HPV infection   . History of herpes simplex type 2 infection     Allergies: Penicillins  Current Outpatient Prescriptions  Medication Sig Dispense Refill  . Leuprolide Acetate (LUPRON DEPOT IM) Inject 1 each into the muscle every 30 (thirty) days.    . norethindrone (AYGESTIN) 5 MG tablet 1 bid until bleeding stopped for three days, then decrease to 1 tab daily (Patient taking differently: Take 5 mg by mouth daily. ) 60 tablet 0  . valACYclovir (VALTREX) 500 MG tablet Take 1 tablet daily, then if flare take 1 tablet twice a day for 3-5 days (Patient taking differently: Take 500 mg by mouth daily. ) 90 tablet 2  . valsartan-hydrochlorothiazide (DIOVAN-HCT) 160-25 MG per tablet Take 1 tablet by mouth every morning.      No current facility-administered medications for this visit.    ROS: Pertinent items noted in HPI and remainder of comprehensive ROS otherwise negative.  Exam:    There were no vitals taken for this visit.  General appearance: alert and cooperative Head: Normocephalic, without obvious abnormality, atraumatic Neck: no adenopathy, supple, symmetrical, trachea midline and thyroid  not enlarged, symmetric, no tenderness/mass/nodules Lungs: clear to auscultation bilaterally Heart: regular rate and rhythm, S1, S2 normal, no murmur, click, rub or gallop Abdomen: soft, non-tender; bowel sounds normal; no masses,  no organomegaly Extremities: extremities normal, atraumatic, no cyanosis or edema Skin: Skin color, texture, turgor normal. No rashes or lesions Lymph nodes: Cervical,  supraclavicular, and axillary nodes normal. no inguinal nodes palpated Neurologic: Grossly normal  Pelvic: External genitalia:  no lesions              Urethra: normal appearing urethra with no masses, tenderness or lesions              Bartholins and Skenes: normal                 Vagina: normal appearing vagina with normal color and discharge, no lesions, no blood              Cervix: normal appearance              Pap taken: No.        Bimanual Exam:  Uterus:  Enlarged with wide base.  Mobile and globular consistent with fibroids 10 weeks size.  Feel uterus will need morcellation for removal if proceed laparoscopically.                                        Adnexa:    not indicated                                      Rectovaginal: Deferred                                      Anus:  normal sphincter tone, no lesions  A: Menorrhagia with irregular cycles, fibroid uterus, chronic anemia, failed myomectomy    P:  TAH/left salpingectomy/possible cystoscopy planned Rx for Vicodin 5/325mg .  Pt reports Percocet makes her jittery and that motrin just doesn't relieve pain for her.  Medications/Vitamins reviewed. Hysterectomy brochure given for pre and post op instructions.  ~30 minutes spent with patient >50% of time was in face to face discussion of above.

## 2015-01-11 NOTE — Addendum Note (Signed)
Addended by: Megan Salon on: 01/11/2015 12:16 PM   Modules accepted: Orders

## 2015-01-11 NOTE — Telephone Encounter (Signed)
The orders are done.  Please advise of need for bowel prep and give instructions.  As well, need to change procedure to TAH/right salpingectomy/possible cystoscopy.  Pt has hx of LSO in the past.  Thanks.

## 2015-01-11 NOTE — Telephone Encounter (Signed)
Case changes made at hospital as directed by Dr Sabra Heck. Spoke to [place] in central scheduling. Call to patient. Bowel prep instructions given. Clear liquids day before surgery with one bottle of magnesium citrate between noon and 2pm day before surgery. Patient verbalized understanding. Encounter closed.

## 2015-01-15 ENCOUNTER — Encounter (HOSPITAL_COMMUNITY): Payer: Self-pay

## 2015-01-15 ENCOUNTER — Encounter (HOSPITAL_COMMUNITY)
Admission: RE | Admit: 2015-01-15 | Discharge: 2015-01-15 | Disposition: A | Discharge status: Home / Self Care | Payer: BC Managed Care – PPO | Source: Ambulatory Visit | Attending: Obstetrics & Gynecology | Admitting: Obstetrics & Gynecology

## 2015-01-15 DIAGNOSIS — N938 Other specified abnormal uterine and vaginal bleeding: Secondary | ICD-10-CM | POA: Insufficient documentation

## 2015-01-15 DIAGNOSIS — D259 Leiomyoma of uterus, unspecified: Secondary | ICD-10-CM | POA: Insufficient documentation

## 2015-01-15 DIAGNOSIS — D649 Anemia, unspecified: Secondary | ICD-10-CM | POA: Insufficient documentation

## 2015-01-15 DIAGNOSIS — Z01818 Encounter for other preprocedural examination: Secondary | ICD-10-CM | POA: Diagnosis not present

## 2015-01-15 LAB — BASIC METABOLIC PANEL
ANION GAP: 5 (ref 5–15)
BUN: 17 mg/dL (ref 6–20)
CALCIUM: 8.5 mg/dL — AB (ref 8.9–10.3)
CO2: 25 mmol/L (ref 22–32)
Chloride: 107 mmol/L (ref 101–111)
Creatinine, Ser: 1.02 mg/dL — ABNORMAL HIGH (ref 0.44–1.00)
GFR calc Af Amer: >60 mL/min (ref >60)
GLUCOSE: 104 mg/dL — AB (ref 65–99)
Potassium: 4.2 mmol/L (ref 3.5–5.1)
SODIUM: 137 mmol/L (ref 135–145)

## 2015-01-15 LAB — CBC
HCT: 32.7 % — ABNORMAL LOW (ref 36.0–46.0)
Hemoglobin: 10 g/dL — ABNORMAL LOW (ref 12.0–15.0)
MCH: 22.6 pg — AB (ref 26.0–34.0)
MCHC: 30.6 g/dL (ref 30.0–36.0)
MCV: 73.8 fL — AB (ref 78.0–100.0)
Platelets: 454 10*3/uL — ABNORMAL HIGH (ref 150–400)
RBC: 4.43 MIL/uL (ref 3.87–5.11)
RDW: 16.8 % — AB (ref 11.5–15.5)
WBC: 4.1 10*3/uL (ref 4.0–10.5)

## 2015-01-15 LAB — TYPE AND SCREEN
ABO/RH(D): A POS
Antibody Screen: NEGATIVE

## 2015-01-15 LAB — ABO/RH: ABO/RH(D): A POS

## 2015-01-15 NOTE — Patient Instructions (Addendum)
   Your procedure is scheduled on: OCT 24 (MONDAY)  Enter through the Main Entrance of [facility] at: 6AM  Pick up the phone at the desk and dial [phone] and inform us of your arrival.  Please call this number if you have any problems the morning of surgery: [phone]  DO NOT EAT OR DRINK AFTER MIDNIGHT OCT 23 (SUNDAY)  Take these medicines the morning of surgery with a SIP OF WATER: TAKE BLOOD PRESSURE MEDS AM OF SURGERY  Do not wear jewelry( body jewelry)  make-up, or FINGER nail polish No metal in your hair or on your body. Do not wear lotions, powders, perfumes.  You may wear deodorant.  Do not bring valuables to the hospital. Contacts, dentures or bridgework may not be worn into surgery.  Leave suitcase in the car. After Surgery it may be brought to your room. For patients being admitted to the hospital, checkout time is 11:00am the day of discharge.

## 2015-01-16 ENCOUNTER — Telehealth: Payer: Self-pay | Admitting: Obstetrics & Gynecology

## 2015-01-16 NOTE — Telephone Encounter (Addendum)
Tamika from [facility] called and said patient needs bowel prep instructions before surgery. Best contact [phone]

## 2015-01-18 NOTE — Telephone Encounter (Signed)
Return call to patient. Notified of Dr Ammie Ferrier instructions. Encounter closed.

## 2015-01-18 NOTE — Telephone Encounter (Signed)
Patient is calling to speak with the nurse .She states [facility] was suppose to call yesterday to talk with the nurse concerning her.

## 2015-01-18 NOTE — Telephone Encounter (Signed)
Bowel prep instructions had been reviewed with patient. See phone note from 01-10-15. Return call to patient. She states she will be unable to begin bowel prep till 3pm day before surgery. Is this too late? She is willing to being on Saturday if you prefer.  She requests modified instructions based on being unable to have access to bathroom on Sunday until 3pm.

## 2015-01-18 NOTE — Telephone Encounter (Signed)
Have pt be on clears starting AM on Sunday and two fleets enemas at 5 and 7 pm on Sunday.

## 2015-01-20 MED ORDER — METRONIDAZOLE IN NACL 5-0.79 MG/ML-% IV SOLN
500.0000 mg | Freq: Once | INTRAVENOUS | Status: AC
  Administered 2015-01-21: 500 mg via INTRAVENOUS
  Filled 2015-01-20: qty 100

## 2015-01-20 MED ORDER — METRONIDAZOLE IN NACL 5-0.79 MG/ML-% IV SOLN
500.0000 mg | INTRAVENOUS | Status: DC
  Filled 2015-01-20: qty 100

## 2015-01-20 MED ORDER — CIPROFLOXACIN IN D5W 400 MG/200ML IV SOLN
400.0000 mg | INTRAVENOUS | Status: AC
  Administered 2015-01-21: 400 mg via INTRAVENOUS
  Filled 2015-01-20: qty 200

## 2015-01-20 MED ORDER — CIPROFLOXACIN IN D5W 400 MG/200ML IV SOLN
400.0000 mg | INTRAVENOUS | Status: DC
  Filled 2015-01-20: qty 200

## 2015-01-21 ENCOUNTER — Inpatient Hospital Stay (HOSPITAL_COMMUNITY)
Admission: AD | Admit: 2015-01-21 | Discharge: 2015-01-23 | DRG: 742 | Disposition: A | Discharge status: Home / Self Care | Payer: BC Managed Care – PPO | Source: Ambulatory Visit | Attending: Obstetrics & Gynecology | Admitting: Obstetrics & Gynecology

## 2015-01-21 ENCOUNTER — Inpatient Hospital Stay (HOSPITAL_COMMUNITY): Payer: BC Managed Care – PPO | Admitting: Anesthesiology

## 2015-01-21 ENCOUNTER — Encounter (HOSPITAL_COMMUNITY)
Admission: AD | Disposition: A | Discharge status: Home / Self Care | Payer: Self-pay | Source: Ambulatory Visit | Attending: Obstetrics & Gynecology

## 2015-01-21 ENCOUNTER — Encounter (HOSPITAL_COMMUNITY): Payer: Self-pay

## 2015-01-21 DIAGNOSIS — D500 Iron deficiency anemia secondary to blood loss (chronic): Secondary | ICD-10-CM | POA: Diagnosis not present

## 2015-01-21 DIAGNOSIS — D259 Leiomyoma of uterus, unspecified: Secondary | ICD-10-CM | POA: Diagnosis present

## 2015-01-21 DIAGNOSIS — I10 Essential (primary) hypertension: Secondary | ICD-10-CM | POA: Diagnosis present

## 2015-01-21 DIAGNOSIS — N920 Excessive and frequent menstruation with regular cycle: Principal | ICD-10-CM | POA: Diagnosis present

## 2015-01-21 DIAGNOSIS — N921 Excessive and frequent menstruation with irregular cycle: Secondary | ICD-10-CM | POA: Diagnosis not present

## 2015-01-21 DIAGNOSIS — D251 Intramural leiomyoma of uterus: Secondary | ICD-10-CM

## 2015-01-21 DIAGNOSIS — D509 Iron deficiency anemia, unspecified: Secondary | ICD-10-CM | POA: Diagnosis present

## 2015-01-21 DIAGNOSIS — N938 Other specified abnormal uterine and vaginal bleeding: Secondary | ICD-10-CM | POA: Diagnosis present

## 2015-01-21 DIAGNOSIS — N809 Endometriosis, unspecified: Secondary | ICD-10-CM | POA: Diagnosis present

## 2015-01-21 DIAGNOSIS — Z6841 Body Mass Index (BMI) 40.0 and over, adult: Secondary | ICD-10-CM

## 2015-01-21 DIAGNOSIS — Z90710 Acquired absence of both cervix and uterus: Secondary | ICD-10-CM | POA: Diagnosis present

## 2015-01-21 HISTORY — PX: ABDOMINAL HYSTERECTOMY: SHX81

## 2015-01-21 HISTORY — PX: CYSTO: SHX6284

## 2015-01-21 HISTORY — PX: BILATERAL SALPINGECTOMY: SHX5743

## 2015-01-21 LAB — PREGNANCY, URINE: PREG TEST UR: NEGATIVE

## 2015-01-21 SURGERY — HYSTERECTOMY, ABDOMINAL
Anesthesia: General | Site: Urethra | Laterality: Right

## 2015-01-21 MED ORDER — MENTHOL 3 MG MT LOZG: 1.0000 | LOZENGE | OROMUCOSAL | Status: DC | PRN

## 2015-01-21 MED ORDER — ALUM & MAG HYDROXIDE-SIMETH 200-200-20 MG/5ML PO SUSP: 30.0000 mL | ORAL | Status: DC | PRN

## 2015-01-21 MED ORDER — DEXAMETHASONE SODIUM PHOSPHATE 10 MG/ML IJ SOLN
INTRAMUSCULAR | Status: DC | PRN
  Administered 2015-01-21: 4 mg via INTRAVENOUS

## 2015-01-21 MED ORDER — EPHEDRINE SULFATE 50 MG/ML IJ SOLN
INTRAMUSCULAR | Status: DC | PRN
  Administered 2015-01-21: 5 mg via INTRAVENOUS
  Administered 2015-01-21: 10 mg via INTRAVENOUS

## 2015-01-21 MED ORDER — PANTOPRAZOLE SODIUM 40 MG IV SOLR
40.0000 mg | Freq: Every day | INTRAVENOUS | Status: DC
  Administered 2015-01-21: 40 mg via INTRAVENOUS
  Filled 2015-01-21: qty 40

## 2015-01-21 MED ORDER — ACETAMINOPHEN 325 MG PO TABS
650.0000 mg | ORAL_TABLET | ORAL | Status: DC | PRN
Start: 2015-01-21 — End: 2015-01-23

## 2015-01-21 MED ORDER — NALOXONE HCL 0.4 MG/ML IJ SOLN: 0.4000 mg | INTRAMUSCULAR | Status: DC | PRN

## 2015-01-21 MED ORDER — DIPHENHYDRAMINE HCL 12.5 MG/5ML PO ELIX: 12.5000 mg | ORAL_SOLUTION | Freq: Four times a day (QID) | ORAL | Status: DC | PRN

## 2015-01-21 MED ORDER — ONDANSETRON HCL 4 MG/2ML IJ SOLN
INTRAMUSCULAR | Status: AC
  Filled 2015-01-21: qty 2

## 2015-01-21 MED ORDER — SCOPOLAMINE 1 MG/3DAYS TD PT72
MEDICATED_PATCH | TRANSDERMAL | Status: AC
  Filled 2015-01-21: qty 1

## 2015-01-21 MED ORDER — MICROFIBRILLAR COLL HEMOSTAT EX POWD
CUTANEOUS | Status: DC | PRN
  Administered 2015-01-21: 1 g via TOPICAL

## 2015-01-21 MED ORDER — LACTATED RINGERS IV SOLN
INTRAVENOUS | Status: DC
  Administered 2015-01-21 (×2): via INTRAVENOUS

## 2015-01-21 MED ORDER — GLYCOPYRROLATE 0.2 MG/ML IJ SOLN
INTRAMUSCULAR | Status: DC | PRN
  Administered 2015-01-21: .8 mg via INTRAVENOUS

## 2015-01-21 MED ORDER — LIDOCAINE HCL (CARDIAC) 20 MG/ML IV SOLN
INTRAVENOUS | Status: AC
  Filled 2015-01-21: qty 5

## 2015-01-21 MED ORDER — IRBESARTAN 150 MG PO TABS
150.0000 mg | ORAL_TABLET | Freq: Every day | ORAL | Status: DC
  Administered 2015-01-22 – 2015-01-23 (×2): 150 mg via ORAL
  Filled 2015-01-21 (×3): qty 1

## 2015-01-21 MED ORDER — SODIUM CHLORIDE 0.9 % IJ SOLN
9.0000 mL | INTRAMUSCULAR | Status: DC | PRN
  Administered 2015-01-22: 3 mL via INTRAVENOUS
  Filled 2015-01-21: qty 9

## 2015-01-21 MED ORDER — PHENYLEPHRINE 40 MCG/ML (10ML) SYRINGE FOR IV PUSH (FOR BLOOD PRESSURE SUPPORT)
PREFILLED_SYRINGE | INTRAVENOUS | Status: AC
  Filled 2015-01-21: qty 10

## 2015-01-21 MED ORDER — HYDROMORPHONE HCL 1 MG/ML IJ SOLN
INTRAMUSCULAR | Status: DC | PRN
  Administered 2015-01-21: 0.5 mg via INTRAVENOUS

## 2015-01-21 MED ORDER — PROPOFOL 10 MG/ML IV BOLUS
INTRAVENOUS | Status: AC
  Filled 2015-01-21: qty 20

## 2015-01-21 MED ORDER — ONDANSETRON HCL 4 MG/2ML IJ SOLN: 4.0000 mg | Freq: Four times a day (QID) | INTRAMUSCULAR | Status: DC | PRN

## 2015-01-21 MED ORDER — PHENYLEPHRINE HCL 10 MG/ML IJ SOLN
INTRAMUSCULAR | Status: DC | PRN
Start: 2015-01-21 — End: 2015-01-21
  Administered 2015-01-21: 40 ug via INTRAVENOUS
  Administered 2015-01-21 (×2): 80 ug via INTRAVENOUS

## 2015-01-21 MED ORDER — NEOSTIGMINE METHYLSULFATE 10 MG/10ML IV SOLN
INTRAVENOUS | Status: DC | PRN
  Administered 2015-01-21: 5 mg via INTRAVENOUS

## 2015-01-21 MED ORDER — HYDROMORPHONE 1 MG/ML IV SOLN
INTRAVENOUS | Status: DC
  Administered 2015-01-21: 12:00:00 via INTRAVENOUS
  Filled 2015-01-21: qty 25

## 2015-01-21 MED ORDER — ROCURONIUM BROMIDE 100 MG/10ML IV SOLN
INTRAVENOUS | Status: DC | PRN
  Administered 2015-01-21: 50 mg via INTRAVENOUS
  Administered 2015-01-21: 10 mg via INTRAVENOUS

## 2015-01-21 MED ORDER — DEXTROSE-NACL 5-0.45 % IV SOLN
INTRAVENOUS | Status: DC
  Administered 2015-01-21 – 2015-01-22 (×2): via INTRAVENOUS

## 2015-01-21 MED ORDER — ONDANSETRON HCL 4 MG/2ML IJ SOLN
INTRAMUSCULAR | Status: DC | PRN
  Administered 2015-01-21: 4 mg via INTRAVENOUS

## 2015-01-21 MED ORDER — MIDAZOLAM HCL 2 MG/2ML IJ SOLN
INTRAMUSCULAR | Status: DC | PRN
  Administered 2015-01-21: 2 mg via INTRAVENOUS

## 2015-01-21 MED ORDER — DIPHENHYDRAMINE HCL 12.5 MG/5ML PO ELIX
12.5000 mg | ORAL_SOLUTION | Freq: Four times a day (QID) | ORAL | Status: DC | PRN
  Administered 2015-01-22 (×2): 12.5 mg via ORAL
  Filled 2015-01-21 (×2): qty 5

## 2015-01-21 MED ORDER — KETOROLAC TROMETHAMINE 30 MG/ML IJ SOLN
INTRAMUSCULAR | Status: AC
  Filled 2015-01-21: qty 1

## 2015-01-21 MED ORDER — VALSARTAN-HYDROCHLOROTHIAZIDE 160-25 MG PO TABS: 1.0000 | ORAL_TABLET | Freq: Every morning | ORAL | Status: DC

## 2015-01-21 MED ORDER — HYDROMORPHONE HCL 1 MG/ML IJ SOLN
INTRAMUSCULAR | Status: AC
  Administered 2015-01-21: 0.5 mg via INTRAVENOUS
  Filled 2015-01-21: qty 1

## 2015-01-21 MED ORDER — FENTANYL CITRATE (PF) 250 MCG/5ML IJ SOLN
INTRAMUSCULAR | Status: AC
  Filled 2015-01-21: qty 25

## 2015-01-21 MED ORDER — NEOSTIGMINE METHYLSULFATE 10 MG/10ML IV SOLN
INTRAVENOUS | Status: AC
  Filled 2015-01-21: qty 1

## 2015-01-21 MED ORDER — SIMETHICONE 80 MG PO CHEW
80.0000 mg | CHEWABLE_TABLET | Freq: Four times a day (QID) | ORAL | Status: DC | PRN
  Administered 2015-01-22: 80 mg via ORAL
  Filled 2015-01-21: qty 1

## 2015-01-21 MED ORDER — MICROFIBRILLAR COLL HEMOSTAT EX POWD
CUTANEOUS | Status: AC
  Filled 2015-01-21: qty 5

## 2015-01-21 MED ORDER — ROCURONIUM BROMIDE 100 MG/10ML IV SOLN
INTRAVENOUS | Status: AC
  Filled 2015-01-21: qty 1

## 2015-01-21 MED ORDER — KETOROLAC TROMETHAMINE 30 MG/ML IJ SOLN
30.0000 mg | Freq: Four times a day (QID) | INTRAMUSCULAR | Status: DC
  Filled 2015-01-21: qty 1

## 2015-01-21 MED ORDER — SCOPOLAMINE 1 MG/3DAYS TD PT72
1.0000 | MEDICATED_PATCH | Freq: Once | TRANSDERMAL | Status: DC
  Administered 2015-01-21: 1.5 mg via TRANSDERMAL

## 2015-01-21 MED ORDER — HYDROMORPHONE 1 MG/ML IV SOLN
INTRAVENOUS | Status: DC
  Administered 2015-01-21: 3 mg via INTRAVENOUS
  Administered 2015-01-21: 1 mg via INTRAVENOUS
  Administered 2015-01-22: 1.6 mg via INTRAVENOUS
  Administered 2015-01-22: 0.8 mg via INTRAVENOUS

## 2015-01-21 MED ORDER — PROPOFOL 10 MG/ML IV BOLUS
INTRAVENOUS | Status: DC | PRN
  Administered 2015-01-21: 200 mg via INTRAVENOUS

## 2015-01-21 MED ORDER — MIDAZOLAM HCL 2 MG/2ML IJ SOLN
INTRAMUSCULAR | Status: AC
  Filled 2015-01-21: qty 4

## 2015-01-21 MED ORDER — DIPHENHYDRAMINE HCL 50 MG/ML IJ SOLN
12.5000 mg | Freq: Four times a day (QID) | INTRAMUSCULAR | Status: DC | PRN
Start: 2015-01-21 — End: 2015-01-21

## 2015-01-21 MED ORDER — FENTANYL CITRATE (PF) 100 MCG/2ML IJ SOLN
INTRAMUSCULAR | Status: AC
  Filled 2015-01-21: qty 4

## 2015-01-21 MED ORDER — SODIUM CHLORIDE 0.9 % IJ SOLN: 9.0000 mL | INTRAMUSCULAR | Status: DC | PRN

## 2015-01-21 MED ORDER — OXYCODONE-ACETAMINOPHEN 5-325 MG PO TABS: 1.0000 | ORAL_TABLET | ORAL | Status: DC | PRN

## 2015-01-21 MED ORDER — HYDROCHLOROTHIAZIDE 25 MG PO TABS
25.0000 mg | ORAL_TABLET | Freq: Every day | ORAL | Status: DC
  Administered 2015-01-22 – 2015-01-23 (×2): 25 mg via ORAL
  Filled 2015-01-21 (×2): qty 1

## 2015-01-21 MED ORDER — KETOROLAC TROMETHAMINE 30 MG/ML IJ SOLN
INTRAMUSCULAR | Status: DC | PRN
  Administered 2015-01-21: 30 mg via INTRAVENOUS

## 2015-01-21 MED ORDER — FENTANYL CITRATE (PF) 100 MCG/2ML IJ SOLN
INTRAMUSCULAR | Status: DC | PRN
  Administered 2015-01-21: 100 ug via INTRAVENOUS
  Administered 2015-01-21: 50 ug via INTRAVENOUS
  Administered 2015-01-21 (×2): 100 ug via INTRAVENOUS

## 2015-01-21 MED ORDER — HYDROMORPHONE HCL 1 MG/ML IJ SOLN
0.2500 mg | INTRAMUSCULAR | Status: DC | PRN
  Administered 2015-01-21: 0.5 mg via INTRAVENOUS
  Administered 2015-01-21: 0.25 mg via INTRAVENOUS
  Administered 2015-01-21: 0.5 mg via INTRAVENOUS

## 2015-01-21 MED ORDER — GLYCOPYRROLATE 0.2 MG/ML IJ SOLN
INTRAMUSCULAR | Status: AC
Start: 2015-01-21 — End: 2015-01-21
  Filled 2015-01-21: qty 3

## 2015-01-21 MED ORDER — ACETAMINOPHEN 160 MG/5ML PO SOLN
975.0000 mg | Freq: Once | ORAL | Status: AC
  Administered 2015-01-21: 975 mg via ORAL

## 2015-01-21 MED ORDER — ACETAMINOPHEN 160 MG/5ML PO SOLN
ORAL | Status: AC
  Filled 2015-01-21: qty 40.6

## 2015-01-21 MED ORDER — LIDOCAINE HCL (CARDIAC) 20 MG/ML IV SOLN
INTRAVENOUS | Status: DC | PRN
  Administered 2015-01-21: 60 mg via INTRAVENOUS

## 2015-01-21 MED ORDER — VALACYCLOVIR HCL 500 MG PO TABS
500.0000 mg | ORAL_TABLET | Freq: Every day | ORAL | Status: DC
  Administered 2015-01-22 – 2015-01-23 (×2): 500 mg via ORAL
  Filled 2015-01-21 (×3): qty 1

## 2015-01-21 MED ORDER — HYDROMORPHONE HCL 1 MG/ML IJ SOLN
INTRAMUSCULAR | Status: AC
  Filled 2015-01-21: qty 1

## 2015-01-21 MED ORDER — DEXAMETHASONE SODIUM PHOSPHATE 4 MG/ML IJ SOLN
INTRAMUSCULAR | Status: AC
  Filled 2015-01-21: qty 1

## 2015-01-21 MED ORDER — KETOROLAC TROMETHAMINE 30 MG/ML IJ SOLN
30.0000 mg | Freq: Four times a day (QID) | INTRAMUSCULAR | Status: DC
  Administered 2015-01-21 – 2015-01-22 (×5): 30 mg via INTRAVENOUS
  Filled 2015-01-21 (×4): qty 1

## 2015-01-21 MED ORDER — DIPHENHYDRAMINE HCL 50 MG/ML IJ SOLN
12.5000 mg | Freq: Four times a day (QID) | INTRAMUSCULAR | Status: DC | PRN
Start: 2015-01-21 — End: 2015-01-22

## 2015-01-21 SURGICAL SUPPLY — 42 items
APL SKNCLS STERI-STRIP NONHPOA (GAUZE/BANDAGES/DRESSINGS) ×3
BENZOIN TINCTURE PRP APPL 2/3 (GAUZE/BANDAGES/DRESSINGS) ×5 IMPLANT
CANISTER SUCT 3000ML (MISCELLANEOUS) ×5 IMPLANT
CLOSURE WOUND 1/2 X4 (GAUZE/BANDAGES/DRESSINGS) ×1
CLOTH BEACON ORANGE TIMEOUT ST (SAFETY) ×5 IMPLANT
DECANTER SPIKE VIAL GLASS SM (MISCELLANEOUS) IMPLANT
DRAPE WARM FLUID 44X44 (DRAPE) ×5 IMPLANT
DRSG OPSITE POSTOP 4X10 (GAUZE/BANDAGES/DRESSINGS) ×5 IMPLANT
DURAPREP 26ML APPLICATOR (WOUND CARE) ×5 IMPLANT
ELECT BLADE 6.5 EXT (BLADE) ×5 IMPLANT
GAUZE SPONGE 4X4 16PLY XRAY LF (GAUZE/BANDAGES/DRESSINGS) ×5 IMPLANT
GLOVE BIOGEL PI IND STRL 7.0 (GLOVE) ×9 IMPLANT
GLOVE BIOGEL PI INDICATOR 7.0 (GLOVE) ×6
GLOVE ECLIPSE 6.5 STRL STRAW (GLOVE) ×15 IMPLANT
LIQUID BAND (GAUZE/BANDAGES/DRESSINGS) ×5 IMPLANT
NEEDLE HYPO 22GX1.5 SAFETY (NEEDLE) IMPLANT
NS IRRIG 1000ML POUR BTL (IV SOLUTION) ×15 IMPLANT
PACK ABDOMINAL GYN (CUSTOM PROCEDURE TRAY) ×5 IMPLANT
PAD ABD 7.5X8 STRL (GAUZE/BANDAGES/DRESSINGS) IMPLANT
PAD OB MATERNITY 4.3X12.25 (PERSONAL CARE ITEMS) ×5 IMPLANT
PENCIL SMOKE EVAC W/HOLSTER (ELECTROSURGICAL) IMPLANT
PROTECTOR NERVE ULNAR (MISCELLANEOUS) IMPLANT
SET CYSTO W/LG BORE CLAMP LF (SET/KITS/TRAYS/PACK) IMPLANT
SHEET LAVH (DRAPES) ×5 IMPLANT
SPONGE GAUZE 2X2 8PLY STER LF (GAUZE/BANDAGES/DRESSINGS)
SPONGE GAUZE 2X2 8PLY STRL LF (GAUZE/BANDAGES/DRESSINGS) IMPLANT
SPONGE GAUZE 4X4 12PLY STER LF (GAUZE/BANDAGES/DRESSINGS) ×10 IMPLANT
SPONGE LAP 18X18 X RAY DECT (DISPOSABLE) IMPLANT
SPONGE SURGIFOAM ABS GEL 12-7 (HEMOSTASIS) IMPLANT
STRIP CLOSURE SKIN 1/2X4 (GAUZE/BANDAGES/DRESSINGS) ×4 IMPLANT
SUT PLAIN 2 0 XLH (SUTURE) ×5 IMPLANT
SUT VIC AB 0 CT1 18XCR BRD8 (SUTURE) ×9 IMPLANT
SUT VIC AB 0 CT1 27 (SUTURE) ×15
SUT VIC AB 0 CT1 27XBRD ANBCTR (SUTURE) ×9 IMPLANT
SUT VIC AB 0 CT1 8-18 (SUTURE) ×15
SUT VIC AB 2-0 CT1 27 (SUTURE) ×5
SUT VIC AB 2-0 CT1 TAPERPNT 27 (SUTURE) ×3 IMPLANT
SUT VIC AB 3-0 PS2 18 (SUTURE) ×5
SUT VIC AB 3-0 PS2 18XBRD (SUTURE) ×3 IMPLANT
SUT VICRYL 0 TIES 12 18 (SUTURE) ×5 IMPLANT
TOWEL OR 17X24 6PK STRL BLUE (TOWEL DISPOSABLE) ×10 IMPLANT
TRAY FOLEY CATH SILVER 14FR (SET/KITS/TRAYS/PACK) ×5 IMPLANT

## 2015-01-21 NOTE — Op Note (Signed)
01/21/2015  10:24 AM  PATIENT:  Angel Bailey  49 y.o. female  PRE-OPERATIVE DIAGNOSIS:  DYSFUNCTIONAL UTERINE BLEEDING UTERINE FIBROIDS AND ANEMIA   POST-OPERATIVE DIAGNOSIS:  DYSFUNCTIONAL UTERINE BLEEDING UTERINE FIBROIDS, ADHESIONS CONSISTENT WITH ENDOMETRIOSIS   PROCEDURE:  Procedure(s): HYSTERECTOMY ABDOMINAL BILATERAL SALPINGECTOMY LYSIS of ADHESIONS  SURGEON:  Marvie Calender SUZANNE  ASSISTANTS: Josefa Half, MD   ANESTHESIA:   general  ESTIMATED BLOOD LOSS: 200cc  BLOOD ADMINISTERED:none   FLUIDS: 2000ccLR  UOP: 200cc clear  SPECIMEN:  Uterus, cervix, right fallopian tube and right ovary.  Specimen weighed 354gm in OR.  DISPOSITION OF SPECIMEN:  PATHOLOGY  FINDINGS: enlarged fibroid uterus with right ovary adhered to posterior side of uterus and into posterior cul de sac, bowel adhesions to left side wall  DESCRIPTION OF OPERATION: Patient is taken to the operating room. She is placed in the supine position. She is a running IV in place. Informed consent was present on the chart. SCDs on her lower extremities and functioning properly. General endotracheal anesthesia was administered by the anesthesia staff without difficulty. Once adequate anesthesia was confirmed the legs are placed in the low lithotomy position in [place].   Chlor prep was then used to prep the abdomen and Betadine was used to prep the inner thighs, perineum and vagina. Once 3 minutes had past the patient was draped in a normal standard fashion. A foley catheter was placed to straight drain and kept sterile on the field.   A Pfannenstiel skin incision was made and carried down through the subcutaneous fat and tissue. The fascia was identified and nicked in the midline. The fascial incision was extended on each side with curved Mayo scissors. The rectus muscles were dissected off the fascia superiorly and inferiorly down to the level of the pubic symphysis. The peritoneum was identified and  opened sharply. The peritoneal incision was extended superiorly and inferiorly down to the level of the bladder.   An Alexis retractor was placed.  Care was taken to ensure no bowel was under the retractor.  Bowel adhesions to left side were taken down sharply.  Bowel was packed anteriorly with three moistened laparotomy sponges.  The left ovary and tube were absent.  There was no round ligament attached to the left side of the uterus.  Long Kelly clamp was placed down the right uteroovarian pedicle to help with visualization.  With uterus on stretch to the left, the left round ligament was elevated, suture-ligated and incised. The posterior leaf of the broad ligament was opened.  The ovary was adhered to the posterior side of the uterus.  There was no way to leave the ovary in place.  There were adhesions in the posterior cul de sac which were freed with sharp and blunt dissection.  Then the inferior leaf of the broad ligament was opened.  The ureter was visualized and the IP ligament was isolated.  Using a Heaney clamp this ligament was doubly clamped. It was then incised and suture tied first with a free-tie of #0 and then with a stich #0 Vicryl.   Sharp dissection was used to create the bladder flap. Hemostasis was achieved with cautery as necessary. Once the bladder was below the level of the uterine artery the right uterine artery was skeletonized. It was then clamped. Backbleeding was clamped as well. The pedicle was incised and then doubly suture-ligated with #0 Vicryl.   Attention was turned to the left side. There were some filmy adhesions present and these were  taken down without difficulty.  The inferior leaf of the broad ligament was opened and the bladder flap was created on the left side.  Once the dissection was below the level of the uterine artery, the uterine artery skeletonized and doubly clamped. The pedicle was incised and doubly suture-ligated with #0 Vicryl.   Bladder flap dissection  continued.  Uterus was amputated from the top of the cervix and the cervix was grasped with Kocher clamps.  The cardinal ligaments were serially clamped cut and suture-ligated with #0 Vicryl using straight Heaney clamps in a standard fashion.  At the base of the cervix a curved Heaney clamp was placed on each side. These pedicles were cut and suture-ligated with #0 Vicryl. Care was taken to ensure the corner was adequately incised. Then the remaining vaginal mucosa was incised close to the cervix as the colpotomy was made, freeing the cervix. Corner stiches were placed to ensure adequate closure and hemostasis at the corners.  Then the vagina was closed with additional figure-of-eight sutures of #0 Vicryl. The pelvis was irrigated. No bleeding was noted but the are of dissection in the posterior cul de sac had raw areas.  Avatine powder was placed in the cul de sac to prevent any bleeding.  No active bleeding was noted. All instruments were removed. The laparotomy sponges were removed. Alexis retractor was removed.    The peritoneum was closed. The fascia was then closed from the corners to the midline with a running stitch of #0 Vicryl. The corners were locked.  The subcutaneous fat was irrigated.  No bleeding was noted.  The subcutaneous fat tissues reapproximated with interrupted sutures of plain gut. Finally the skin was closed with subcuticular stitch of 3-0 Vicryl. Steri-Strips were applied to the incision and a honeycomb dressing placed.    Patient tolerated procedure well. Legs are positioned back to the supine position. Sponge, lap, needle, initially counts were correct x2. She was awakened from anesthesia, extubated and taken to recovery in stable condition.    COUNTS:  YES  PLAN OF CARE: Transfer to PACU

## 2015-01-21 NOTE — Anesthesia Preprocedure Evaluation (Addendum)
Anesthesia Evaluation  Patient identified by MRN, date of birth, ID band Patient awake    Reviewed: Allergy & Precautions, NPO status , Patient's Chart, lab work & pertinent test results  History of Anesthesia Complications Negative for: history of anesthetic complications  Airway Mallampati: II  TM Distance: >3 FB Neck ROM: Full    Dental no notable dental hx. (+) Dental Advisory Given,    Pulmonary neg pulmonary ROS,    Pulmonary exam normal breath sounds clear to auscultation       Cardiovascular hypertension, Pt. on medications and On Medications Normal cardiovascular exam Rhythm:Regular Rate:Normal     Neuro/Psych negative neurological ROS  negative psych ROS   GI/Hepatic negative GI ROS, Neg liver ROS,   Endo/Other  Morbid obesityobesity  Renal/GU negative Renal ROS  negative genitourinary   Musculoskeletal negative musculoskeletal ROS (+)   Abdominal   Peds negative pediatric ROS (+)  Hematology  (+) anemia ,   Anesthesia Other Findings Hb 10 HTN MO  Reproductive/Obstetrics negative OB ROS                            Anesthesia Physical  Anesthesia Plan  ASA: II  Anesthesia Plan: General   Post-op Pain Management:    Induction: Intravenous  Airway Management Planned: Oral ETT  Additional Equipment:   Intra-op Plan:   Post-operative Plan: Extubation in OR  Informed Consent: I have reviewed the patients History and Physical, chart, labs and discussed the procedure including the risks, benefits and alternatives for the proposed anesthesia with the patient or authorized representative who has indicated his/her understanding and acceptance.   Dental advisory given  Plan Discussed with: CRNA  Anesthesia Plan Comments:        Anesthesia Quick Evaluation

## 2015-01-21 NOTE — H&P (Signed)
Angel Bailey is an 49 y.o. 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  Pt has been on progesterone only OCPs, aygestin, has undergone a myomectomy, and received one Depo Lupron dose.  Pt is tired of bleeding and has decided to proceed with definitive therapy.  Additional options for treatment including uterine artery embolization and repeat myomectomy have been investigated.  Pt even went for another opinion this year to the fibroid clinic at [facility].  She has decided to proceed with definitive therapy.  Pt is here and ready to proceed.  Risks and benefits have been reviewed.  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    Past Medical History  Diagnosis Date  . Hypertension   . History of gonorrhea     2008  . History of kidney stones   . H/O Fitz-Hugh-Curtis syndrome     ABDOMINAL ADHESIONS  . History of ovarian cyst   . Leiomyoma of uterus   . History of HPV infection   . History of herpes simplex type 2 infection     Past Surgical History  Procedure Laterality Date  . Robotic assisted diagnostic laparoscopy  12-31-2009    W/  LEFT SALPINGOOPHORECTOMY/  LEFT URETEROLYSIS/  EXTENSIVE LYSIS ADHESIONS  . Cystoscopy w/ ureteral stent placement  12/ 2010  . Cysto/  ureteroscopy laser lithotripsy  stone extraction  04-18-2009  . Tonsillectomy  as child  . Myomectomy  04-25-14    Family History  Problem Relation Age of Onset  . Hypertension Mother   . Diabetes Mother   . Hypertension Father   . Hypertension Maternal  Grandmother     Social History:  reports that she has never smoked. She has never used smokeless tobacco. She reports that she drinks alcohol. She reports that she does not use illicit drugs.  Allergies:  Allergies  Allergen Reactions  . Penicillins Swelling    Has patient had a PCN reaction causing immediate rash, facial/tongue/throat swelling, SOB or lightheadedness with hypotension: yes Has patient had a PCN reaction causing severe rash involving mucus membranes or skin necrosis: Yes Has patient had a PCN reaction that required hospitalization No Has patient had a PCN reaction occurring within the last 10 years: No If all of the above answers are "NO", then may proceed with Cephalosporin use.     Prescriptions prior to admission  Medication Sig Dispense Refill Last Dose  . norethindrone (AYGESTIN) 5 MG tablet 1 bid until bleeding stopped for three days, then decrease to 1 tab daily (Patient taking differently: Take 5 mg by mouth daily. ) 60 tablet 0 Taking  . valACYclovir (VALTREX) 500 MG tablet Take 1 tablet daily, then if flare take 1 tablet twice a day for 3-5 days (Patient taking differently: Take 500 mg by mouth daily. ) 90 tablet 2 Taking  . valsartan-hydrochlorothiazide (DIOVAN-HCT) 160-25 MG per tablet Take 1 tablet by mouth every morning.    01/21/2015 at 0500  . HYDROcodone-acetaminophen (NORCO/VICODIN) 5-325 MG tablet Take 1-2 tablets by mouth every 6 (six) hours as needed for moderate pain or severe pain. 30 tablet 0  Review of Systems  All other systems reviewed and are negative.   Blood pressure 129/76, pulse 66, temperature 98.4 F (36.9 C), temperature source Oral, resp. rate 20, SpO2 99 %. Physical Exam  Constitutional: She is oriented to person, place, and time. She appears well-developed and well-nourished.  Cardiovascular: Normal rate and regular rhythm.   Respiratory: Effort normal and breath sounds normal.  Neurological: She is alert and oriented to  person, place, and time.  Skin: Skin is warm and dry.  Psychiatric: She has a normal mood and affect.    No results found for this or any previous visit (from the past 24 hour(s)).  No results found.  Assessment/Plan: 49 yo G0 SAAF here for definitive treatment of menorrhagia, DUB, anemia with TAH/right salpingectomy/possible RSO/cystoscopy.  Pt here and ready to proceed.  Her mother accompanies her.    Hale Bogus [facility] 01/21/2015, 6:56 AM

## 2015-01-21 NOTE — Anesthesia Procedure Notes (Signed)
Procedure Name: Intubation Date/Time: 01/21/2015 7:30 AM Performed by: Jonna Munro Pre-anesthesia Checklist: Patient identified, Emergency Drugs available, Suction available, Patient being monitored and Timeout performed Patient Re-evaluated:Patient Re-evaluated prior to inductionOxygen Delivery Method: Circle system utilized Preoxygenation: Pre-oxygenation with 100% oxygen Intubation Type: IV induction Ventilation: Mask ventilation without difficulty Laryngoscope Size: Mac and 3 Grade View: Grade I Tube type: Oral Tube size: 7.0 mm Number of attempts: 1 Airway Equipment and Method: Stylet Placement Confirmation: ETT inserted through vocal cords under direct vision,  positive ETCO2 and breath sounds checked- equal and bilateral Secured at: 21 cm Tube secured with: Tape Dental Injury: Teeth and Oropharynx as per pre-operative assessment

## 2015-01-21 NOTE — Anesthesia Postprocedure Evaluation (Signed)
  Anesthesia Post-op Note  Patient: Angel Bailey  Procedure(s) Performed: Procedure(s): HYSTERECTOMY ABDOMINAL (N/A) BILATERAL SALPINGECTOMY (Right) CYSTO (N/A) Patient is awake and responsive. Pain and nausea are reasonably well controlled. Vital signs are stable and clinically acceptable. Oxygen saturation is clinically acceptable. There are no apparent anesthetic complications at this time. Patient is ready for discharge.

## 2015-01-21 NOTE — Transfer of Care (Signed)
Immediate Anesthesia Transfer of Care Note  Patient: Angel Bailey  Procedure(s) Performed: Procedure(s): HYSTERECTOMY ABDOMINAL (N/A) BILATERAL SALPINGECTOMY (Right) CYSTO (N/A)  Patient Location: PACU  Anesthesia Type:General  Level of Consciousness: awake, alert  and oriented  Airway & Oxygen Therapy: Patient Spontanous Breathing and Patient connected to nasal cannula oxygen  Post-op Assessment: Report given to RN and Post -op Vital signs reviewed and stable  Post vital signs: Reviewed and stable  Last Vitals:  Filed Vitals:   01/21/15 0610  BP: 129/76  Pulse: 66  Temp: 36.9 C  Resp: 20    Complications: No apparent anesthesia complications

## 2015-01-22 ENCOUNTER — Encounter (HOSPITAL_COMMUNITY): Payer: Self-pay | Admitting: Obstetrics & Gynecology

## 2015-01-22 DIAGNOSIS — Z90710 Acquired absence of both cervix and uterus: Secondary | ICD-10-CM | POA: Diagnosis present

## 2015-01-22 DIAGNOSIS — D509 Iron deficiency anemia, unspecified: Secondary | ICD-10-CM | POA: Diagnosis present

## 2015-01-22 DIAGNOSIS — N920 Excessive and frequent menstruation with regular cycle: Secondary | ICD-10-CM | POA: Diagnosis present

## 2015-01-22 DIAGNOSIS — N809 Endometriosis, unspecified: Secondary | ICD-10-CM | POA: Diagnosis present

## 2015-01-22 DIAGNOSIS — I10 Essential (primary) hypertension: Secondary | ICD-10-CM | POA: Diagnosis present

## 2015-01-22 DIAGNOSIS — Z6841 Body Mass Index (BMI) 40.0 and over, adult: Secondary | ICD-10-CM | POA: Diagnosis not present

## 2015-01-22 DIAGNOSIS — D259 Leiomyoma of uterus, unspecified: Secondary | ICD-10-CM | POA: Diagnosis present

## 2015-01-22 DIAGNOSIS — N938 Other specified abnormal uterine and vaginal bleeding: Secondary | ICD-10-CM | POA: Diagnosis present

## 2015-01-22 LAB — BASIC METABOLIC PANEL
ANION GAP: 4 — AB (ref 5–15)
BUN: 11 mg/dL (ref 6–20)
CALCIUM: 7.8 mg/dL — AB (ref 8.9–10.3)
CHLORIDE: 104 mmol/L (ref 101–111)
CO2: 26 mmol/L (ref 22–32)
Creatinine, Ser: 1.13 mg/dL — ABNORMAL HIGH (ref 0.44–1.00)
GFR calc non Af Amer: 57 mL/min — ABNORMAL LOW (ref >60)
Glucose, Bld: 128 mg/dL — ABNORMAL HIGH (ref 65–99)
Potassium: 3.6 mmol/L (ref 3.5–5.1)
SODIUM: 134 mmol/L — AB (ref 135–145)

## 2015-01-22 LAB — CBC
HCT: 27.4 % — ABNORMAL LOW (ref 36.0–46.0)
HEMOGLOBIN: 8.5 g/dL — AB (ref 12.0–15.0)
MCH: 22.7 pg — AB (ref 26.0–34.0)
MCHC: 31 g/dL (ref 30.0–36.0)
MCV: 73.1 fL — AB (ref 78.0–100.0)
PLATELETS: 379 10*3/uL (ref 150–400)
RBC: 3.75 MIL/uL — AB (ref 3.87–5.11)
RDW: 16.3 % — ABNORMAL HIGH (ref 11.5–15.5)
WBC: 7.6 10*3/uL (ref 4.0–10.5)

## 2015-01-22 MED ORDER — DOCUSATE SODIUM 100 MG PO CAPS
100.0000 mg | ORAL_CAPSULE | Freq: Two times a day (BID) | ORAL | Status: DC
  Administered 2015-01-22 – 2015-01-23 (×3): 100 mg via ORAL
  Filled 2015-01-22 (×3): qty 1

## 2015-01-22 MED ORDER — HYDROMORPHONE HCL 2 MG PO TABS
2.0000 mg | ORAL_TABLET | ORAL | Status: DC | PRN
  Administered 2015-01-22 – 2015-01-23 (×3): 2 mg via ORAL
  Filled 2015-01-22 (×4): qty 1

## 2015-01-22 NOTE — Progress Notes (Signed)
1 Day Post-Op Procedure(s) (LRB): HYSTERECTOMY ABDOMINAL (N/A) BILATERAL SALPINGECTOMY (Right) CYSTO (N/A)  Subjective: Patient reports she's doing well.   Minimal bleeding. Good pain control.  No nausea.  Voiding well.  Has passed flatus.   Objective: I have reviewed patient's vital signs, intake and output, medications and labs.  General: alert and cooperative Resp: clear to auscultation bilaterally Cardio: regular rate and rhythm, S1, S2 normal, no murmur, click, rub or gallop GI: soft, non-tender; bowel sounds normal; no masses,  no organomegaly and incision: clean, dry and intact Extremities: extremities normal, atraumatic, no cyanosis or edema Vaginal Bleeding: minimal  Assessment: s/p Procedure(s): HYSTERECTOMY ABDOMINAL (N/A) BILATERAL SALPINGECTOMY (Right) CYSTO (N/A): stable and progressing well  Plan: Continue current care CBC and BMP   LOS: 1 day    Hale Bogus [facility] 01/22/2015, 6:49 PM

## 2015-01-22 NOTE — Progress Notes (Signed)
1 Day Post-Op Procedure(s) (LRB): HYSTERECTOMY ABDOMINAL (N/A) BILATERAL SALPINGECTOMY (Right) CYSTO (N/A)  Subjective: Patient reports no nausea and improved pain control.  Has walked.  Feels "tired"  Objective: I have reviewed patient's vital signs, intake and output, medications and labs. Tmax 98.9, R: 15-32, P: 48-82, BP 95-107/46-58 UOP:  2400cc Post op Hb: 8.5, Creatinine: 1.13 (was 1.02 Pre op)  General: alert and cooperative Resp: clear to auscultation bilaterally Cardio: regular rate and rhythm, S1, S2 normal, no murmur, click, rub or gallop GI: soft, non-tender; bowel sounds normal; no masses,  no organomegaly and incision: clean, dry and intact Extremities: extremities normal, atraumatic, no cyanosis or edema Vaginal Bleeding: none  Foley removed  Assessment: s/p Procedure(s): HYSTERECTOMY ABDOMINAL (N/A) BILATERAL SALPINGECTOMY (Right) CYSTO (N/A): stable  Plan: Advance diet Encourage ambulation Advance to PO medication Discontinue IV fluids  Continue SCDs and GI prophylaxis Repeat CBC in am  (of note, I did see pt last night with evening rounds but did not write a note as computers were all being used.  I did not document my note later.  Her exam was normal, VSS/AF, and she had adequate UOP.)  LOS: 1 day    Angel Bailey 01/22/2015, 8:52 AM

## 2015-01-23 ENCOUNTER — Other Ambulatory Visit: Payer: Self-pay | Admitting: Obstetrics and Gynecology

## 2015-01-23 DIAGNOSIS — Z0289 Encounter for other administrative examinations: Secondary | ICD-10-CM

## 2015-01-23 LAB — BASIC METABOLIC PANEL
ANION GAP: 3 — AB (ref 5–15)
BUN: 12 mg/dL (ref 6–20)
CO2: 28 mmol/L (ref 22–32)
Calcium: 8.4 mg/dL — ABNORMAL LOW (ref 8.9–10.3)
Chloride: 106 mmol/L (ref 101–111)
Creatinine, Ser: 1.14 mg/dL — ABNORMAL HIGH (ref 0.44–1.00)
GFR, EST NON AFRICAN AMERICAN: 56 mL/min — AB (ref >60)
GLUCOSE: 120 mg/dL — AB (ref 65–99)
POTASSIUM: 3.9 mmol/L (ref 3.5–5.1)
SODIUM: 137 mmol/L (ref 135–145)

## 2015-01-23 LAB — CBC
HCT: 27.8 % — ABNORMAL LOW (ref 36.0–46.0)
Hemoglobin: 8.5 g/dL — ABNORMAL LOW (ref 12.0–15.0)
MCH: 22.5 pg — ABNORMAL LOW (ref 26.0–34.0)
MCHC: 30.6 g/dL (ref 30.0–36.0)
MCV: 73.7 fL — AB (ref 78.0–100.0)
PLATELETS: 427 10*3/uL — AB (ref 150–400)
RBC: 3.77 MIL/uL — AB (ref 3.87–5.11)
RDW: 16.8 % — ABNORMAL HIGH (ref 11.5–15.5)
WBC: 5.8 10*3/uL (ref 4.0–10.5)

## 2015-01-23 MED ORDER — MENTHOL 3 MG MT LOZG: 1.0000 | LOZENGE | OROMUCOSAL | Status: DC | PRN

## 2015-01-23 MED ORDER — DOCUSATE SODIUM 100 MG PO CAPS: 100.0000 mg | ORAL_CAPSULE | Freq: Two times a day (BID) | ORAL | Status: DC

## 2015-01-23 MED ORDER — HYDROMORPHONE HCL 2 MG PO TABS: 2.0000 mg | ORAL_TABLET | Freq: Four times a day (QID) | ORAL | Status: DC | PRN

## 2015-01-23 MED ORDER — HYDROCODONE-ACETAMINOPHEN 5-325 MG PO TABS: 1.0000 | ORAL_TABLET | Freq: Four times a day (QID) | ORAL | Status: DC | PRN

## 2015-01-23 NOTE — Progress Notes (Signed)
Pts   Teaching complete   MOM  With pt   Out in wheelchair

## 2015-01-23 NOTE — Progress Notes (Signed)
2 Days Post-Op Procedure(s) (LRB): HYSTERECTOMY ABDOMINAL (N/A) BILATERAL SALPINGECTOMY (Right) CYSTO (N/A)  Subjective: Patient reports she's feeling well but "sore".  Pain is under good control.  No nausea.  Continues to pass flatus.  No issues with voiding.  Walking well.     Objective: I have reviewed patient's vital signs, intake and output, medications and labs. Repeat hb 8.5 (stable)  General: alert and cooperative Resp: clear to auscultation bilaterally Cardio: regular rate and rhythm, S1, S2 normal, no murmur, click, rub or gallop GI: soft, non-tender; bowel sounds normal; no masses,  no organomegaly and incision: clean, dry and intact Extremities: extremities normal, atraumatic, no cyanosis or edema Vaginal Bleeding: none  Assessment: s/p Procedure(s): HYSTERECTOMY ABDOMINAL (N/A) BILATERAL SALPINGECTOMY (Right) CYSTO (N/A): progressing well  Plan: Repeating BMP to reassess creatinine.  Hopeful discharge later today.  LOS: 2 days    Hale Bogus [facility] 01/23/2015, 10:53 AM

## 2015-01-23 NOTE — Discharge Summary (Signed)
Physician Discharge Summary  Patient ID: Angel Bailey MRN: [id number] DOB/AGE: September 02, 1965 49 y.o.  Admit date: 01/21/2015 Discharge date: 01/23/2015  Admission Diagnoses: fibroid uterus, enlarged uterus, menorrhagia with irregular cycles, chronic iron deficiency anemia, hypertension, obesity  Discharge Diagnoses:  Active Problems:   * No active hospital problems. *  Discharged Condition: good  Hospital Course: Patient ID: Angel Bailey [id number]  Admit date: 01/21/2015  5:50 AM  Discharge date: No discharge date for patient encounter.  Admission Diagnoses: DYSFUNCTIONAL UTERINE BLEEDING UTERINE FIBROIDS AND ANEMIA   Discharge Diagnoses:  Same as Admission Diagnosis  Discharged Condition: good  Hospital Course: Patient admitted through same day surgery.  She was taken to the OR where TAH/RSO was performed.  EBL was 200cc.  Surgery was without complications.  Patient transferred to the PACU and then to the third floor for the remainder of her hospitalization.  Foley was left in place during POD #1.  She had, initially, a low dose Dialudid PCA for pain control.  This needed to be changed to high dose PCA and this worked very well for her.  During her hospitalization, her vital signs were stable, she was afebrile and she had excellent UOP.  She was seen in the evening and morning of each hospital day.  On POD#1, she had no nausea and had good pain control.  Exam was normal and she had excellent bowel sounds.  She was able to transition to regular diet.  Foley was removed and she voided without difficulty.  She passed flatus for the first time during the mid-day of POD#1.  Post op hb was decreased to 8.5 form 10.0.  BMP was obtained due to anti-hypertensive medications pt was taking and to check electrolytes.  Creatinine increased from 1.02 pre op to 1.13.  There was no intraoperative concern for ureteral or bladder injury as both ureters were easily seen during the surgery and there was  no concern about being out laterally with any of the dissection.  Pt was monitored for another 24 hours and seen in evening of POD#1 and am of POD#2.  Pt continued to do well.  Labs were repeated in AM of POD#2 and were essentially the same.  Pt had no back/flank pain, had acceptable pain medication requirement.  Decision made to monitor creatine and hemoglobin as outpatient as she was doing so well.   Consults: None  Significant Diagnostic Studies: labs: post op hb 8.5.  BMP with creatinine of 1.13 (up from 1.02) post operatively  Treatments: surgery: TAH/RSO/LOA  Discharge Exam: Blood pressure 126/77, pulse 64, temperature 98.4 F (36.9 C), temperature source Oral, resp. rate 16, height 5\' 2"  (1.575 m), weight 218 lb (98.884 kg), SpO2 100 %. General appearance: alert and cooperative Resp: clear to auscultation bilaterally Cardio: regular rate and rhythm, S1, S2 normal, no murmur, click, rub or gallop GI: soft, non-tender; bowel sounds normal; no masses,  no organomegaly Extremities: extremities normal, atraumatic, no cyanosis or edema Incision/Wound: c/d/i, dressing intact  Disposition: 01-Home or Self Care     Medication List    ASK your doctor about these medications        HYDROcodone-acetaminophen 5-325 MG tablet  Commonly known as:  NORCO/VICODIN  Take 1-2 tablets by mouth every 6 (six) hours as needed for moderate pain or severe pain.     HYDROmorphone 2 MG tablet  Commonly known as:  DILAUDID  Take 1 tablet (2 mg total) by mouth every 6 (six) hours as needed for moderate  pain or severe pain.     valACYclovir 500 MG tablet  Commonly known as:  VALTREX  Take 1 tablet daily, then if flare take 1 tablet twice a day for 3-5 days     valsartan-hydrochlorothiazide 160-25 MG tablet  Commonly known as:  DIOVAN-HCT  Take 1 tablet by mouth every morning.           Follow-up Information    Follow up with Lyman Speller, MD On 01/25/2015.   Specialty:  Gynecology    Contact information:   [address] [place] [address] [phone]       Please follow up.   Why:  10:15am      Signed: Lyman Speller 01/23/2015, 2:02 PM

## 2015-01-23 NOTE — Discharge Instructions (Signed)
Post Op Hysterectomy Instructions Please read the instructions below. Refer to these instructions for the next few weeks. These instructions provide you with general information on caring for yourself after surgery. Your caregiver may also give you specific instructions. While your treatment has been planned according to the most current medical practices available, unavoidable problems sometimes happen. If you have any problems or questions after you leave, please call your caregiver.  HOME CARE INSTRUCTIONS Healing will take time. You will have discomfort, tenderness, swelling and bruising at the operative site for a couple of weeks. This is normal and will get better as time goes on.   Only take over-the-counter or prescription medicines for pain, discomfort or fever as directed by your caregiver.   Do not take aspirin. It can cause bleeding.   Do not drive when taking pain medication.   Follow your caregivers advice regarding diet, exercise, lifting, driving and general activities.   Resume your usual diet as directed and allowed.   Get plenty of rest and sleep.   Do not douche, use tampons, or have sexual intercourse until your caregiver gives you permission. .   Take your temperature if you feel hot or flushed.   You may shower today when you get home.  No tub bath for one week.    Do not drink alcohol until you are not taking any narcotic pain medications.   Try to have someone home with you for a week or two to help with the household activities.   Be careful over the next two to three weeks with any activities at home that involve lifting, pushing, or pulling.  Listen to your body--if something feels uncomfortable to do, then don't do it.  Make sure you and your family understands everything about your operation and recovery.   Walking up stairs is fine.  Do not sign any legal documents until you feel normal again.   Keep all your follow-up appointments as recommended by  your caregiver.   PLEASE CALL THE OFFICE IF:  There is swelling, redness or increasing pain in the wound area.   Pus is coming from the wound.   You notice a bad smell from the wound or surgical dressing.   You have pain, redness and swelling from the intravenous site.   The wound is breaking open (the edges are not staying together).    You develop pain or bleeding when you urinate.   You develop abnormal vaginal discharge.   You have any type of abnormal reaction or develop an allergy to your medication.   You need stronger pain medication for your pain   SEEK IMMEDIATE MEDICAL CARE:  You develop a temperature of 100.5 or higher.   You develop abdominal pain.   You develop chest pain.   You develop shortness of breath.   You pass out.   You develop pain, swelling or redness of your leg.   You develop heavy vaginal bleeding with or without blood clots.   MEDICATIONS:  Restart your regular medications BUT wait one week before restarting all vitamins and mineral supplements  Use Motrin 800mg  every 8 hours for the next several days.  This will help you use less narcotic.  Use Dialudid 2mg , one tablet every 6 hours as needed for pain.  You may switch to using the Vicodin after you finish the Diluadid or if you feel like your pain is decreasing and you need a lesser strength medication.  You may use an over the counter  stool softener like Colace or Dulcolax to help with starting a bowel movement.  Start the day after you go home.  Warm liquids, fluids, and ambulation help too.  If you have not had a bowel movement in four days, you need to call the office.

## 2015-01-24 ENCOUNTER — Telehealth: Payer: Self-pay | Admitting: Obstetrics & Gynecology

## 2015-01-24 NOTE — Telephone Encounter (Signed)
Angel Bailey calling from [employer] to get confirmation the patient does not need Lupron anymore.

## 2015-01-24 NOTE — Telephone Encounter (Signed)
Spoke with [employer]. Advised patient will no longer need the rx for Lupron [place]. No medical information released. Prescription discontinued.  Patient had TAH on 01/21/2015.  Routing to provider for final review. Patient agreeable to disposition. Will close encounter.

## 2015-01-25 ENCOUNTER — Ambulatory Visit (INDEPENDENT_AMBULATORY_CARE_PROVIDER_SITE_OTHER): Payer: BC Managed Care – PPO | Admitting: Obstetrics & Gynecology

## 2015-01-25 ENCOUNTER — Encounter: Payer: Self-pay | Admitting: Obstetrics & Gynecology

## 2015-01-25 VITALS — BP 118/76 | HR 60 | Resp 16 | Wt 221.0 lb

## 2015-01-25 DIAGNOSIS — R748 Abnormal levels of other serum enzymes: Secondary | ICD-10-CM

## 2015-01-25 DIAGNOSIS — R7989 Other specified abnormal findings of blood chemistry: Secondary | ICD-10-CM

## 2015-01-25 DIAGNOSIS — D62 Acute posthemorrhagic anemia: Secondary | ICD-10-CM

## 2015-01-25 DIAGNOSIS — D251 Intramural leiomyoma of uterus: Secondary | ICD-10-CM

## 2015-01-25 LAB — BASIC METABOLIC PANEL
BUN: 15 mg/dL (ref 7–25)
CHLORIDE: 103 mmol/L (ref 98–110)
CO2: 28 mmol/L (ref 20–31)
Calcium: 8.7 mg/dL (ref 8.6–10.2)
Creat: 1.05 mg/dL (ref 0.50–1.10)
GLUCOSE: 90 mg/dL (ref 65–99)
Potassium: 4 mmol/L (ref 3.5–5.3)
Sodium: 137 mmol/L (ref 135–146)

## 2015-01-25 LAB — CBC
HEMATOCRIT: 29.6 % — AB (ref 36.0–46.0)
Hemoglobin: 9.3 g/dL — ABNORMAL LOW (ref 12.0–15.0)
MCH: 22.6 pg — ABNORMAL LOW (ref 26.0–34.0)
MCHC: 31.4 g/dL (ref 30.0–36.0)
MCV: 72 fL — AB (ref 78.0–100.0)
MPV: 7.4 fL — ABNORMAL LOW (ref 8.6–12.4)
Platelets: 494 10*3/uL — ABNORMAL HIGH (ref 150–400)
RBC: 4.11 MIL/uL (ref 3.87–5.11)
RDW: 16.3 % — AB (ref 11.5–15.5)
WBC: 4.3 10*3/uL (ref 4.0–10.5)

## 2015-01-25 MED ORDER — ESTRADIOL 1 MG PO TABS: 1.0000 mg | ORAL_TABLET | Freq: Every day | ORAL | Status: DC

## 2015-01-25 NOTE — Progress Notes (Signed)
Post Operative Visit  Procedure: Abdominal hysterectomy/bilateral salpingectomy Days Post-op: 5 days  Subjective: Doing well.  On vicodin.  Hasn't taken any since yesterday evening.  Voiding well.  No vaginal bleeding.  Sleeping is good.  Hasn't had a BM yet.  On colace.  Pt has place on back of right knee from TED hose that she wants me to look at today.  It is tender.    Objective: LMP 08/24/2014  EXAM General: alert and cooperative Resp: clear to auscultation bilaterally Cardio: regular rate and rhythm, S1, S2 normal, no murmur, click, rub or gallop GI: soft, non-tender; bowel sounds normal; no masses,  no organomegaly and incision: clean, dry and intact Extremities: extremities normal, atraumatic, no cyanosis or edema Vaginal Bleeding: none  Skin:  Area of skin abrasion from TED hose.  No edema, no calf tenderness, no erythema.  Not consistent with DVT.  Appears to be superficial skin abrasion that is crusting.  Assessment: s/p TAH/RSO  Plan: Recheck 11/15 CBC and BMD today Start estradiol 1.0mg  on Monday.  Rx to pharmacy.

## 2015-01-28 ENCOUNTER — Ambulatory Visit: Payer: BC Managed Care – PPO | Admitting: Obstetrics & Gynecology

## 2015-02-12 ENCOUNTER — Ambulatory Visit: Payer: BC Managed Care – PPO | Admitting: Nurse Practitioner

## 2015-02-18 ENCOUNTER — Ambulatory Visit (INDEPENDENT_AMBULATORY_CARE_PROVIDER_SITE_OTHER): Payer: BC Managed Care – PPO | Admitting: Obstetrics & Gynecology

## 2015-02-18 VITALS — BP 116/78 | HR 84 | Resp 20 | Wt 216.0 lb

## 2015-02-18 DIAGNOSIS — T814XXA Infection following a procedure, initial encounter: Secondary | ICD-10-CM

## 2015-02-18 DIAGNOSIS — T8140XA Infection following a procedure, unspecified, initial encounter: Secondary | ICD-10-CM

## 2015-02-18 NOTE — Progress Notes (Signed)
Post Operative Visit  Procedure: abdominal hysterectomy/bilateral salpingectomy Days Post-op: 1 month  Subjective: Doing well.  Mother accompanies pt today.  She hasn't tried to drive yet.  HIGHLY encouraged her to start.  Taking no pain medications.  Still having some fatigue.  A little worried at this point about return to work due to the fatigue.  Her FMLA paperwork was done for six weeks.  Pt is going to give me an update in about 10 days.  May need another appt.  D/w pt hx of anemia.  I want to repeat CBC in about three months.  Bowel and bladder function normal.  Denies VB.  So happy about this.  Has started dating someone.  Very happy about this.  Objective: BP 116/78 mmHg  Pulse 84  Resp 20  Wt 216 lb (97.977 kg)  LMP 08/24/2014  EXAM General: alert and cooperative Resp: clear to auscultation bilaterally Cardio: regular rate and rhythm, S1, S2 normal, no murmur, click, rub or gallop GI: soft, non-tender; bowel sounds normal; no masses,  no organomegaly and incision: clean, dry and intact Extremities: extremities normal, atraumatic, no cyanosis or edema Vaginal Bleeding: none  Gyn:  NAEFG, vagina without lesions, cuff healing well without lesions or erythema  Assessment: s/p TAH/BSO Post op fatigue H/O anemia  Plan: Recheck 12 weeks.  Pt will give update in about 10 days.  If fatigue is not a lot better, will see her again then and do blood work at that time.

## 2015-02-19 ENCOUNTER — Encounter: Payer: Self-pay | Admitting: Obstetrics & Gynecology

## 2015-03-04 ENCOUNTER — Telehealth: Payer: Self-pay | Admitting: Obstetrics & Gynecology

## 2015-03-04 NOTE — Telephone Encounter (Signed)
Can you check with her that she is going to return full time and not start with reduced hours?  Thanks.

## 2015-03-04 NOTE — Telephone Encounter (Signed)
Patient needs a return to work letter done. She is going back to work this Wednesday (03/06/2015). Fax #: AttnDon Perking [phone] Best # to reach: [phone]  Patient apologetic she said Dr. Sabra Heck told her to call us 10 days in advance, she forgot.

## 2015-03-04 NOTE — Telephone Encounter (Signed)
Dr.Miller , letter written and saved in [facility] for your review prior to printing for signature. Do any restrictions need to be added to the letter?

## 2015-03-05 NOTE — Telephone Encounter (Signed)
Spoke with patient. Patient states that she will be returning to work full time.

## 2015-03-05 NOTE — Telephone Encounter (Signed)
Letter signed.  Ok to fax to number requested by pt.  Thanks.

## 2015-03-05 NOTE — Telephone Encounter (Signed)
Spoke with patient. Advised return to work letter has been faxed to the attention of Don Perking at [phone] with cover sheet and confirmation.  Routing to provider for final review. Patient agreeable to disposition. Will close encounter.

## 2015-04-04 ENCOUNTER — Telehealth: Payer: Self-pay | Admitting: Obstetrics & Gynecology

## 2015-04-04 NOTE — Telephone Encounter (Signed)
Patient called and said, "I thought I had some irritation from some body soap but I stopped using it and I am still having burning when I urinate. I'd like to speak to the nurse to see if she has any advice for me." Patient declined an appointment until speaking with the nurse.

## 2015-04-04 NOTE — Telephone Encounter (Signed)
Spoke with patient. Patient states that earlier this week she began to experience vaginal burning. States it is constant with and without urination. Had recently started using a new soap. Changed back to her original soap with no relief. Urine is darker and has an odor. Denies any vaginal discharge, fevers, chills, or lower back pain. Patient has a history of HSV. Thought she may be having an outbreak. Started taking Valtrex 500 mg twice a day three days ago with no relief. Advised she will need to be seen in the office for further evaluation. Patient is agreeable. Offered appointment today but patient declines. Appointment scheduled for tomorrow 04/05/2015 at 4 pm with Kem Boroughs, FNP. Advised patient if symptoms worsen or she develops fevers, chills, or lower back pain will need to be seen with our office earlier or at a local urgent care or ER. Patient is agreeable.  Routing to provider for final review. Patient agreeable to disposition. Will close encounter.

## 2015-04-05 ENCOUNTER — Encounter: Payer: Self-pay | Admitting: Nurse Practitioner

## 2015-04-05 ENCOUNTER — Ambulatory Visit (INDEPENDENT_AMBULATORY_CARE_PROVIDER_SITE_OTHER): Payer: BC Managed Care – PPO | Admitting: Nurse Practitioner

## 2015-04-05 VITALS — BP 120/82 | HR 64 | Ht 62.0 in | Wt 222.0 lb

## 2015-04-05 DIAGNOSIS — N760 Acute vaginitis: Secondary | ICD-10-CM

## 2015-04-05 DIAGNOSIS — R8290 Unspecified abnormal findings in urine: Secondary | ICD-10-CM

## 2015-04-05 LAB — POCT URINALYSIS DIPSTICK
Bilirubin, UA: NEGATIVE
Glucose, UA: NEGATIVE
KETONES UA: NEGATIVE
LEUKOCYTES UA: NEGATIVE
Nitrite, UA: NEGATIVE
PH UA: 6
PROTEIN UA: NEGATIVE
RBC UA: NEGATIVE
Urobilinogen, UA: NEGATIVE

## 2015-04-05 MED ORDER — METRONIDAZOLE 0.75 % VA GEL: 1.0000 | Freq: Every day | VAGINAL | Status: DC

## 2015-04-05 NOTE — Progress Notes (Signed)
50 y.o. Single African American female G0P0 here with complaint of vaginal symptoms of  burning, and slight increase discharge. Describes discharge as minimal.  She has recently used a different soap from [place] and [employer] works and thought that may have caused the symptoms.  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 Onset of symptoms 4 days ago. Denies vaginal dryness.  New partner since 03/22/15 with ? STD concerns. She had only been SA X 2 since 2013. Urinary symptoms none.   Contraception is post hysterectomy 01/21/15.  On ERT since then.  No pain or discomfort with SA.   O:  Healthy female WDWN Affect: normal, orientation x 3  Exam: no distress Abdomen: soft and non tender Lymph node: no enlargement or tenderness Pelvic exam: External genital: normal female BUS: negative Vagina: white discharge noted.  Affirm taken. Cervix: absent Uterus: absent Adnexa:normal, non tender, no masses or fullness noted    A: Vaginitis - most likely BV  S/P robotic assisted Hysterectomy 01/21/15  R/O STD's GC and Chlamydia only done today   P: Discussed findings of vaginitis and etiology. Discussed Aveeno or baking soda sitz bath for comfort. Avoid moist clothes or pads for extended period of time. If working out in gym clothes or swim suits for long periods of time change underwear or bottoms of swimsuit if possible. Olive Oil/Coconut Oil use for skin protection prior to activity can be used to external skin.  Rx: will go ahead and treat with Metrogel until results come back  Follow with [facility]  [facility] prn

## 2015-04-06 LAB — WET PREP BY MOLECULAR PROBE
Candida species: POSITIVE — AB
Gardnerella vaginalis: POSITIVE — AB
Trichomonas vaginosis: NEGATIVE

## 2015-04-06 NOTE — Progress Notes (Signed)
Encounter reviewed by Dr. Maryam Feely Amundson C. Silva.  

## 2015-04-08 ENCOUNTER — Other Ambulatory Visit: Payer: Self-pay | Admitting: Nurse Practitioner

## 2015-04-08 MED ORDER — FLUCONAZOLE 150 MG PO TABS: 150.0000 mg | ORAL_TABLET | Freq: Once | ORAL | Status: DC

## 2015-04-09 LAB — IPS N GONORRHOEA AND CHLAMYDIA BY PCR

## 2015-04-23 ENCOUNTER — Telehealth: Payer: Self-pay | Admitting: Obstetrics & Gynecology

## 2015-04-23 ENCOUNTER — Other Ambulatory Visit: Payer: Self-pay | Admitting: Nurse Practitioner

## 2015-04-23 MED ORDER — ESTRADIOL 1 MG PO TABS: 1.0000 mg | ORAL_TABLET | Freq: Every day | ORAL | Status: DC

## 2015-04-23 NOTE — Telephone Encounter (Signed)
Estradiol Last OV: 04-05-15 Next AEX: 05-24-15 Dr. Sabra Heck Please advise

## 2015-04-23 NOTE — Telephone Encounter (Signed)
Refill request came in through the refill pool today. Rx for Valtrex 500 mg tablets #30 0RF and Estradiol 1 mg tablets #30 0RF were sent in by Kem Boroughs, FNP. Patient has been notified she is over due for her mammogram and needs this for further refills of Estradiol. Patient is checking on why she was only given a 1 month supply of Valtrex. Patient is scheduled for 3 month recheck on 05/24/2015 with Dr.Miller.  Routing to Kem Boroughs, FNP for review as authorizing provider on medication.

## 2015-04-23 NOTE — Telephone Encounter (Signed)
She can only get 1 month of estradiol - her last Mammo was 07/24/13 - mammogram must be updated now before she comes back to see Dr. Sabra Heck on 05/24/15 as she cannot get new RX at that time.  Also a refill of Valtrex was given also to last her a month until seen.

## 2015-04-23 NOTE — Telephone Encounter (Signed)
Medication refill request: Valtrex  Last AEX:  02-08-14 Next AEX: 04-05-15 Last MMG (if hormonal medication request): 07-24-13 WNL Refill authorized: please advise

## 2015-04-23 NOTE — Telephone Encounter (Signed)
Patient is requesting a refill for Valtrex and Estradiol to be sent to [facility] @ [phone]. She also wants to speak with the nurse concerning the Estradiol. She is not sure of she is suppose to be still taking this.

## 2015-04-23 NOTE — Telephone Encounter (Signed)
Patient had 2 rx's sent in today one for estradiol and another for valtrex. Patient wanted to know why only 1 month was called in for both. I told patient she is overdue for her mammogram and that needs to be current. Patient checking on why only 1 month was called in for the valtrex. Best # to reach: [phone]

## 2015-04-24 NOTE — Telephone Encounter (Signed)
Spoke with patient. Advised of message as seen below from Angel Bailey, [place]. Patient is agreeable. She would like to reschedule her appointment with Dr.Miller due to scheduling conflict. Appointment rescheduled to 05/10/2015 at 9:15 am with Dr.Miller. Patient is agreeable to date and time. She will call to schedule her mammogram at this time.  Cc: Dr.Miller  Routing to provider for final review. Patient agreeable to disposition. Will close encounter.

## 2015-04-26 ENCOUNTER — Other Ambulatory Visit: Payer: Self-pay

## 2015-04-26 DIAGNOSIS — Z1231 Encounter for screening mammogram for malignant neoplasm of breast: Secondary | ICD-10-CM

## 2015-05-10 ENCOUNTER — Ambulatory Visit: Payer: BC Managed Care – PPO | Admitting: Obstetrics & Gynecology

## 2015-05-10 ENCOUNTER — Ambulatory Visit (INDEPENDENT_AMBULATORY_CARE_PROVIDER_SITE_OTHER): Payer: BC Managed Care – PPO | Admitting: Obstetrics & Gynecology

## 2015-05-10 ENCOUNTER — Ambulatory Visit: Payer: BC Managed Care – PPO

## 2015-05-10 ENCOUNTER — Encounter: Payer: Self-pay | Admitting: Obstetrics & Gynecology

## 2015-05-10 ENCOUNTER — Ambulatory Visit
Admission: RE | Admit: 2015-05-10 | Discharge: 2015-05-10 | Disposition: A | Discharge status: Home / Self Care | Payer: BC Managed Care – PPO | Source: Ambulatory Visit

## 2015-05-10 VITALS — BP 130/86 | HR 84 | Resp 16 | Ht 62.0 in | Wt 227.0 lb

## 2015-05-10 DIAGNOSIS — Z1231 Encounter for screening mammogram for malignant neoplasm of breast: Secondary | ICD-10-CM

## 2015-05-10 DIAGNOSIS — D500 Iron deficiency anemia secondary to blood loss (chronic): Secondary | ICD-10-CM

## 2015-05-10 LAB — CBC
HCT: 33.8 % — ABNORMAL LOW (ref 36.0–46.0)
HEMOGLOBIN: 10.1 g/dL — AB (ref 12.0–15.0)
MCH: 21.1 pg — AB (ref 26.0–34.0)
MCHC: 29.9 g/dL — AB (ref 30.0–36.0)
MCV: 70.6 fL — AB (ref 78.0–100.0)
MPV: 7.8 fL — ABNORMAL LOW (ref 8.6–12.4)
Platelets: 406 10*3/uL — ABNORMAL HIGH (ref 150–400)
RBC: 4.79 MIL/uL (ref 3.87–5.11)
RDW: 18.9 % — ABNORMAL HIGH (ref 11.5–15.5)
WBC: 3.7 10*3/uL — ABNORMAL LOW (ref 4.0–10.5)

## 2015-05-10 LAB — IBC PANEL
%SAT: 6 % — ABNORMAL LOW (ref 11–50)
TIBC: 402 ug/dL (ref 250–450)
UIBC: 378 ug/dL (ref 125–400)

## 2015-05-10 LAB — FERRITIN: FERRITIN: 8 ng/mL — AB (ref 10–232)

## 2015-05-10 LAB — IRON: Iron: 24 ug/dL — ABNORMAL LOW (ref 40–190)

## 2015-05-10 MED ORDER — VALACYCLOVIR HCL 500 MG PO TABS: ORAL_TABLET | ORAL | Status: DC

## 2015-05-10 NOTE — Progress Notes (Signed)
Subjective:     Patient ID: Angel Bailey, female   DOB: June 12, 1965, 50 y.o.   MRN: [id number]  HPI 50 yo G0 SAAF her for follow-up after having TAH due to large, fibroid uterus.  Pt reports she's feeling really good.  Having a few hot flashes.  Didn't start the estradiol.  States she is still craving ice.    No vaginal bleeding.  + SA.  Pt reports this is really good.  Pt really happy with relationship.    Work is good but busy.    Review of Systems  All other systems reviewed and are negative.      Objective:   Physical Exam  Constitutional: She appears well-developed and well-nourished.  Cardiovascular: Normal rate and regular rhythm.   Pulmonary/Chest: Effort normal and breath sounds normal.  Abdominal: Soft. Bowel sounds are normal.  Genitourinary: Vagina normal. There is no rash, tenderness, lesion or injury on the right labia. There is no rash, tenderness, lesion or injury on the left labia.  Uterus and cervix is surgically absent.    Lymphadenopathy:       Right: No inguinal adenopathy present.       Left: No inguinal adenopathy present.  Skin: Skin is warm and dry.  Psychiatric: She has a normal mood and affect.       Assessment:     S/p TAH/bilateral salpingectomy Still craving ice H/O anemia Mild hot flashes     Plan:     CBC, iron studies today.  Pt may need iron infusions.

## 2015-05-10 NOTE — Progress Notes (Deleted)
Post Operative Visit  Procedure:HYSTERECTOMY ABDOMINAL (N/A Abdomen) BILATERAL SALPINGECTOMY (Right Abdomen)  CYSTO (N/A Urethra)    Days Post-op: 3 months and 16 days.  Subjective: ***  Objective: LMP 08/24/2014 (Exact Date)  EXAM General: {Exam; general:16600} Resp: {Exam; lung:16931} Cardio: {Exam; heart:5510} GI: {Exam, XF:8167074 Extremities: {Exam; extremity:5109} Vaginal Bleeding: {exam; vaginal bleeding:3041122}  Assessment: s/p ***  Plan: Recheck {NUMBER 1-10:22536} weeks ***

## 2015-05-18 NOTE — Addendum Note (Signed)
Addended by: Megan Salon on: 05/18/2015 06:58 AM   Modules accepted: Orders

## 2015-05-24 ENCOUNTER — Ambulatory Visit: Payer: BC Managed Care – PPO | Admitting: Obstetrics & Gynecology

## 2015-06-18 ENCOUNTER — Telehealth: Payer: Self-pay | Admitting: Obstetrics & Gynecology

## 2015-06-18 ENCOUNTER — Telehealth: Payer: Self-pay | Admitting: Hematology & Oncology

## 2015-06-18 NOTE — Telephone Encounter (Signed)
Called patient's cell phone and left a message requesting patient to call back to schedule new patient appt. Also called patient's home #, but the voicemail mailbox is full.       [facility].

## 2015-06-18 NOTE — Telephone Encounter (Signed)
I received a call from Angel Bailey' with Dr Antonieta Pert office regarding a referral that was placed on, 05/18/15, for this patient. Angel Bailey' states they have attempted to contact this patient for scheduling multiple times, with no return calls from the patient. On the last call attempt, patient advised she was at a work conference and would contact their office upon her return. Patient has not called back to schedule.   Dr Antonieta Pert office would like to know how to proceed. Routing to Dr Sabra Heck for review.

## 2015-06-21 ENCOUNTER — Encounter: Payer: Self-pay | Admitting: Obstetrics & Gynecology

## 2015-06-21 NOTE — Telephone Encounter (Signed)
Letter written.  Will bring to you.  Ok to let them know to close encounter.  Thanks.

## 2015-06-24 NOTE — Telephone Encounter (Signed)
Letter from Dr Sabra Heck sent. Encounter closed.

## 2015-07-12 ENCOUNTER — Ambulatory Visit (HOSPITAL_BASED_OUTPATIENT_CLINIC_OR_DEPARTMENT_OTHER): Payer: BC Managed Care – PPO | Admitting: Hematology & Oncology

## 2015-07-12 ENCOUNTER — Other Ambulatory Visit (HOSPITAL_BASED_OUTPATIENT_CLINIC_OR_DEPARTMENT_OTHER): Payer: BC Managed Care – PPO

## 2015-07-12 ENCOUNTER — Ambulatory Visit: Payer: BC Managed Care – PPO

## 2015-07-12 ENCOUNTER — Encounter: Payer: Self-pay | Admitting: Hematology & Oncology

## 2015-07-12 VITALS — BP 175/90 | HR 51 | Temp 97.8°F | Resp 98 | Ht 62.0 in | Wt 227.0 lb

## 2015-07-12 DIAGNOSIS — D509 Iron deficiency anemia, unspecified: Secondary | ICD-10-CM | POA: Insufficient documentation

## 2015-07-12 DIAGNOSIS — N921 Excessive and frequent menstruation with irregular cycle: Secondary | ICD-10-CM | POA: Insufficient documentation

## 2015-07-12 DIAGNOSIS — D500 Iron deficiency anemia secondary to blood loss (chronic): Secondary | ICD-10-CM

## 2015-07-12 HISTORY — DX: Iron deficiency anemia, unspecified: D50.9

## 2015-07-12 HISTORY — DX: Excessive and frequent menstruation with irregular cycle: N92.1

## 2015-07-12 LAB — CHCC SATELLITE - SMEAR

## 2015-07-12 LAB — CBC WITH DIFFERENTIAL (CANCER CENTER ONLY)
BASO#: 0 10*3/uL (ref 0.0–0.2)
BASO%: 0.4 % (ref 0.0–2.0)
EOS%: 4.2 % (ref 0.0–7.0)
Eosinophils Absolute: 0.2 10*3/uL (ref 0.0–0.5)
HCT: 31.5 % — ABNORMAL LOW (ref 34.8–46.6)
HGB: 9.7 g/dL — ABNORMAL LOW (ref 11.6–15.9)
LYMPH#: 1.8 10*3/uL (ref 0.9–3.3)
LYMPH%: 38 % (ref 14.0–48.0)
MCH: 22.4 pg — ABNORMAL LOW (ref 26.0–34.0)
MCHC: 30.8 g/dL — ABNORMAL LOW (ref 32.0–36.0)
MCV: 73 fL — AB (ref 81–101)
MONO#: 0.4 10*3/uL (ref 0.1–0.9)
MONO%: 8.9 % (ref 0.0–13.0)
NEUT#: 2.3 10*3/uL (ref 1.5–6.5)
NEUT%: 48.5 % (ref 39.6–80.0)
PLATELETS: 419 10*3/uL — AB (ref 145–400)
RBC: 4.34 10*6/uL (ref 3.70–5.32)
RDW: 18.3 % — ABNORMAL HIGH (ref 11.1–15.7)
WBC: 4.8 10*3/uL (ref 3.9–10.0)

## 2015-07-12 LAB — IRON AND TIBC
%SAT: 6 % — AB (ref 21–57)
Iron: 22 ug/dL — ABNORMAL LOW (ref 41–142)
TIBC: 379 ug/dL (ref 236–444)
UIBC: 357 ug/dL (ref 120–384)

## 2015-07-12 LAB — FERRITIN: Ferritin: 6 ng/ml — ABNORMAL LOW (ref 9–269)

## 2015-07-12 LAB — RETICULOCYTES: Reticulocyte Count: 0.9 % (ref 0.6–2.6)

## 2015-07-12 NOTE — Progress Notes (Signed)
Referral MD  Reason for Referral: Iron deficiency anemia secondary to past menometrorrhagia   Chief Complaint  Patient presents with  . Other    New Patient  : My iron is low. I feel tired.  HPI: Angel Bailey is a very charming 50 year old African-American female. She's been in good health. She has had heavy monthly cycles. She finally had a have a hysterectomy. This is back in October 2016. She had a large fibroid. In the past, she has had a myomectomy.  She has been quite tired. She has had fatigue.  She had her iron studies checked by her gynecologist. She is found to be anemic with marked iron deficiency.  Back in [place],  her labs showed a white count 3.7. He will attempt 0.1. Platelet count 06-5998. Her MCV was 71. Her ferritin was 6 area and iron saturation was 6%. Her total serum iron was 24.  She does chew ice a lot. She has had no mouth sores. She just does not have a lot of stamina.  There is no history of blood problems in the family. There is no sickle cell.  She's not had any bleeding otherwise. She's had no weight loss or weight gain. She really cannot exercise a lot because of her fatigue.  She still trying to work full time.  She's had no nausea or vomiting. She is not a vegetarian.  She's had no rashes. She had no joint problems.  She did have a gallstone removed about 6 years ago. Her gallbladder is still intact.  Overall, her performance status is ECOG 0.  She does not smoke. She rarely has alcoholic beverages.    Past Medical History  Diagnosis Date  . Hypertension   . History of gonorrhea     2008  . History of kidney stones   . H/O Fitz-Hugh-Curtis syndrome     ABDOMINAL ADHESIONS  . History of ovarian cyst   . Leiomyoma of uterus   . History of HPV infection   . History of herpes simplex type 2 infection   . Iron deficiency anemia 07/12/2015  . Menometrorrhagia 07/12/2015  :  Past Surgical History  Procedure Laterality Date  . Robotic  assisted diagnostic laparoscopy  12-31-2009    W/  LEFT SALPINGOOPHORECTOMY/  LEFT URETEROLYSIS/  EXTENSIVE LYSIS ADHESIONS  . Cystoscopy w/ ureteral stent placement  12/ 2010  . Cysto/  ureteroscopy laser lithotripsy  stone extraction  04-18-2009  . Tonsillectomy  as child  . Myomectomy  04-25-14  . Abdominal hysterectomy N/A 01/21/2015    Procedure: HYSTERECTOMY ABDOMINAL;  Surgeon: Megan Salon, MD;  Location: [facility];  Service: Gynecology;  Laterality: N/A;  . Bilateral salpingectomy Right 01/21/2015    Procedure: BILATERAL SALPINGECTOMY;  Surgeon: Megan Salon, MD;  Location: [facility];  Service: Gynecology;  Laterality: Right;  . Cysto N/A 01/21/2015    Procedure: CYSTO;  Surgeon: Megan Salon, MD;  Location: [facility];  Service: Gynecology;  Laterality: N/A;  :   Current outpatient prescriptions:  .  valACYclovir (VALTREX) 500 MG tablet, take 1 tablet by mouth once daily.  BID for 3 days with flare., Disp: 90 tablet, Rfl: 4 .  valsartan-hydrochlorothiazide (DIOVAN-HCT) 160-25 MG per tablet, Take 1 tablet by mouth every morning. , Disp: , Rfl: :  :  Allergies  Allergen Reactions  . Penicillins Swelling    Has patient had a PCN reaction causing immediate rash, facial/tongue/throat swelling, SOB or lightheadedness with hypotension: yes Has patient had a  PCN reaction causing severe rash involving mucus membranes or skin necrosis: Yes Has patient had a PCN reaction that required hospitalization No Has patient had a PCN reaction occurring within the last 10 years: No If all of the above answers are "NO", then may proceed with Cephalosporin use.   :  Family History  Problem Relation Age of Onset  . Hypertension Mother   . Diabetes Mother   . Hypertension Father   . Hypertension Maternal Grandmother   :  Social History   Social History  . Marital Status: Single    Spouse Name: N/A  . Number of Children: N/A  . Years of Education: N/A   Occupational History  . Not on file.    Social History Main Topics  . Smoking status: Never Smoker   . Smokeless tobacco: Never Used  . Alcohol Use: 0.0 oz/week    0 Standard drinks or equivalent per week     Comment: occasional  . Drug Use: No  . Sexual Activity: Not on file   Other Topics Concern  . Not on file   Social History Narrative  :  Pertinent items are noted in HPI.  Exam: @[facility]ITALS @ Obese African American female in no obvious distress. Vital signs are temperature of 97.8. Pulse 51. Blood pressure was 85/90. Weight is 227 pounds. Head and neck exam shows no ocular or oral lesions. She has no palpable cervical or supraclavicular lymph nodes. Her conjunctiva are slightly pale. There is no adenopathy or palpable thyroid. Lungs are clear bilaterally. Cardiac exam regular rate and rhythm with no murmurs, rubs or bruits. Abdomen is soft. She is somewhat obese. She has no fluid wave. There is no palpable liver or spleen tip. Back exam shows no tenderness over the spine, ribs or hips. Extremity shows no clubbing, cyanosis or edema. She has good range of motion of her joints. Skin exam shows no rashes, ecchymoses or petechia. Neurological exam shows no focal neurological deficits.    Recent Labs  07/12/15 1026  WBC 4.8  HGB 9.7*  HCT 31.5*  PLT 419*   No results for input(s): NA, K, CL, CO2, GLUCOSE, BUN, CREATININE, CALCIUM in the last 72 hours.  Blood smear review:  Moderate anisocytosis and poikilocytosis. She has microcytic red cells. She has hypochromic red cells. There are no inclusion bodies. She has no target cells. I see no nucleated red blood cells. She has no schistocytes or spherocytes. White cells been normal in morphology maturation. She has no immature myeloid cells. There are no atypical lymphocytes. Platelets are adequate in number and size. Platelets are well granulated.  Pathology: None     Assessment and Plan:  Angel Bailey is a very charming 50 year old African-American female. She is clearly  iron deficient. We'll see what her iron studies are right now. By her blood smear, I would have to say that she is still iron deficient.  She would like to try oral iron. I except this. Hopefully, she will absorb it well enough so that her iron studies will improve and her hemoglobin will improve.  I will like to see her back in about 6 weeks. I would think that 6 weeks would be a long enough time to see oral iron is going to work. If not, then she will consider IV iron.  I don't think she has a hemoglobinopathy. I don't think she has thalassemia.  She does not need a bone marrow test.  There really is no family history of colon cancer  so I don't think she needs to have her stools checked or had a colonoscopy or upper endoscopy be done.  I spent about 45 minutes with her. She is very nice. I answered all of her questions.

## 2015-07-15 ENCOUNTER — Telehealth: Payer: Self-pay | Admitting: *Deleted

## 2015-07-15 ENCOUNTER — Other Ambulatory Visit: Payer: Self-pay | Admitting: Nurse Practitioner

## 2015-07-15 LAB — HEMOGLOBINOPATHY EVALUATION
HEMOGLOBIN A2 QUANTITATION: 1.9 % (ref 0.7–3.1)
HGB C: 0 %
HGB S: 0 %
Hemoglobin F Quantitation: 0 % (ref 0.0–2.0)
Hgb A: 98.1 % — ABNORMAL HIGH (ref 94.0–98.0)

## 2015-07-15 NOTE — Telephone Encounter (Addendum)
Patient aware. She is taking oral supplements.  ----- Message from Volanda Napoleon, MD sent at 07/15/2015  7:51 AM EDT ----- Call - iron level is really low!!!  Make sure you take the oral iron everyday!!!  Angel Bailey

## 2015-07-15 NOTE — Telephone Encounter (Signed)
Medication refill request: Fusion Plus Last AEX:  02/08/14 with PG Next AEX: 01/13/16 with SM Last MMG (if hormonal medication request): n/a Refill authorized: Please advise

## 2015-08-27 ENCOUNTER — Other Ambulatory Visit (HOSPITAL_BASED_OUTPATIENT_CLINIC_OR_DEPARTMENT_OTHER): Payer: BC Managed Care – PPO

## 2015-08-27 ENCOUNTER — Telehealth: Payer: Self-pay | Admitting: *Deleted

## 2015-08-27 ENCOUNTER — Encounter: Payer: Self-pay | Admitting: Hematology & Oncology

## 2015-08-27 ENCOUNTER — Ambulatory Visit (HOSPITAL_BASED_OUTPATIENT_CLINIC_OR_DEPARTMENT_OTHER): Payer: BC Managed Care – PPO | Admitting: Hematology & Oncology

## 2015-08-27 VITALS — BP 136/90 | HR 68 | Temp 97.8°F | Resp 16 | Ht 62.0 in | Wt 227.0 lb

## 2015-08-27 DIAGNOSIS — D509 Iron deficiency anemia, unspecified: Secondary | ICD-10-CM

## 2015-08-27 DIAGNOSIS — D500 Iron deficiency anemia secondary to blood loss (chronic): Secondary | ICD-10-CM

## 2015-08-27 LAB — CBC WITH DIFFERENTIAL (CANCER CENTER ONLY)
BASO#: 0 10*3/uL (ref 0.0–0.2)
BASO%: 0.3 % (ref 0.0–2.0)
EOS ABS: 0.2 10*3/uL (ref 0.0–0.5)
EOS%: 3.1 % (ref 0.0–7.0)
HCT: 37.5 % (ref 34.8–46.6)
HGB: 11.8 g/dL (ref 11.6–15.9)
LYMPH#: 1.9 10*3/uL (ref 0.9–3.3)
LYMPH%: 33.1 % (ref 14.0–48.0)
MCH: 24.2 pg — AB (ref 26.0–34.0)
MCHC: 31.5 g/dL — AB (ref 32.0–36.0)
MCV: 77 fL — ABNORMAL LOW (ref 81–101)
MONO#: 0.5 10*3/uL (ref 0.1–0.9)
MONO%: 8 % (ref 0.0–13.0)
NEUT%: 55.5 % (ref 39.6–80.0)
NEUTROS ABS: 3.2 10*3/uL (ref 1.5–6.5)
PLATELETS: 385 10*3/uL (ref 145–400)
RBC: 4.87 10*6/uL (ref 3.70–5.32)
RDW: 20.2 % — AB (ref 11.1–15.7)
WBC: 5.8 10*3/uL (ref 3.9–10.0)

## 2015-08-27 LAB — IRON AND TIBC
%SAT: 17 % — AB (ref 21–57)
IRON: 56 ug/dL (ref 41–142)
TIBC: 321 ug/dL (ref 236–444)
UIBC: 266 ug/dL (ref 120–384)

## 2015-08-27 LAB — FERRITIN: FERRITIN: 33 ng/mL (ref 9–269)

## 2015-08-27 LAB — CHCC SATELLITE - SMEAR

## 2015-08-27 NOTE — Progress Notes (Signed)
Hematology and Oncology Follow Up Visit  Angel Bailey [id number] [birth year] 50 y.o. 08/27/2015   Principle Diagnosis:   Iron deficiency anemia secondary to menometrorrhagia  Current Therapy:    Oral iron     Interim History:  Angel Bailey is back for follow-up. We first saw her back in April. At that point time, she was clearly iron deficient. She had had a hysterectomy because of menometrorrhagia.  She was on oral iron. We saw her, her ferritin was only 6 with iron saturation of 6%. Patient did not want have IV iron. Because she was on that symptomatically, I felt weak and his watch on oral iron.  She's had no problems with abdominal pain. There is no nausea or vomiting. She has no cough. There's been no change in bowel or bladder habits.  She is given ready to go to [place] in 3 weeks. She will be there for about 4 days. She is looking for to this.  Overall, her performance status is ECOG 1.  Medications:  Current outpatient prescriptions:  .  Fe Fum-Fe Poly-Vit C-Lactobac (FUSION) 65-65-25-30 MG CAPS, Take by mouth., Disp: , Rfl:  .  Iron-FA-B Cmp-C-Biot-Probiotic (FUSION PLUS) CAPS, TAKE ONE CAPSULE BY MOUTH ONCE DAILY, Disp: 30 capsule, Rfl: 5 .  valACYclovir (VALTREX) 500 MG tablet, take 1 tablet by mouth once daily.  BID for 3 days with flare., Disp: 90 tablet, Rfl: 4 .  valsartan-hydrochlorothiazide (DIOVAN-HCT) 160-25 MG per tablet, Take 1 tablet by mouth every morning. , Disp: , Rfl:   Allergies:  Allergies  Allergen Reactions  . Penicillins Swelling    Has patient had a PCN reaction causing immediate rash, facial/tongue/throat swelling, SOB or lightheadedness with hypotension: yes Has patient had a PCN reaction causing severe rash involving mucus membranes or skin necrosis: Yes Has patient had a PCN reaction that required hospitalization No Has patient had a PCN reaction occurring within the last 10 years: No If all of the above answers are "NO", then may proceed  with Cephalosporin use.     Past Medical History, Surgical history, Social history, and Family History were reviewed and updated.  Review of Systems: As above  Physical Exam:  height is 5\' 2"  (1.575 m) and weight is 227 lb (102.967 kg). Her oral temperature is 97.8 F (36.6 C). Her blood pressure is 136/90 and her pulse is 68. Her respiration is 16.   Wt Readings from Last 3 Encounters:  08/27/15 227 lb (102.967 kg)  07/12/15 227 lb (102.967 kg)  05/10/15 227 lb (102.967 kg)     Head and neck exam shows no ocular or oral lesions. She has no palpable cervical or supraclavicular lymph nodes. Her conjunctiva are slightly pale. There is no adenopathy or palpable thyroid. Lungs are clear bilaterally. Cardiac exam regular rate and rhythm with no murmurs, rubs or bruits. Abdomen is soft. She is somewhat obese. She has no fluid wave. There is no palpable liver or spleen tip. Back exam shows no tenderness over the spine, ribs or hips. Extremity shows no clubbing, cyanosis or edema. She has good range of motion of her joints. Skin exam shows no rashes, ecchymoses or petechia. Neurological exam shows no focal neurological deficits.  Lab Results  Component Value Date   WBC 5.8 08/27/2015   HGB 11.8 08/27/2015   HCT 37.5 08/27/2015   MCV 77* 08/27/2015   PLT 385 08/27/2015     Chemistry      Component Value Date/Time   NA 137 01/25/2015  1117   K 4.0 01/25/2015 1117   CL 103 01/25/2015 1117   CO2 28 01/25/2015 1117   BUN 15 01/25/2015 1117   CREATININE 1.05 01/25/2015 1117   CREATININE 1.14* 01/23/2015 1021      Component Value Date/Time   CALCIUM 8.7 01/25/2015 1117         Impression and Plan: Angel Bailey is a 50 year old Afro-American female. She has iron deficiency anemia. Of note, we did do a hemoglobin electrophoresis on her. She has a normal hemoglobin quantity.  Her hemoglobin is much better. Her MCV is coming up. Her plans are going down. This is all very consistent with  resolution of her iron deficiency.  I will like to see her back in 3-4 months. If all looks good then, then I think we can let her go from the clinic. She would be okay with this.  I told her to make sure that she drinks a lot of water went down to [place]. I do not want to see her get dehydrated.   Volanda Napoleon, MD 5/30/201710:45 AM

## 2015-08-27 NOTE — Telephone Encounter (Addendum)
Patient aware of results.   ----- Message from Volanda Napoleon, MD sent at 08/27/2015  2:41 PM EDT ----- Call - iron is better!!!! Keep on taking it!!!!!   pete

## 2015-08-28 LAB — RETICULOCYTES: RETICULOCYTE COUNT: 1 % (ref 0.6–2.6)

## 2016-01-06 ENCOUNTER — Ambulatory Visit (HOSPITAL_BASED_OUTPATIENT_CLINIC_OR_DEPARTMENT_OTHER): Payer: BC Managed Care – PPO | Admitting: Hematology & Oncology

## 2016-01-06 ENCOUNTER — Encounter: Payer: Self-pay | Admitting: Hematology & Oncology

## 2016-01-06 ENCOUNTER — Other Ambulatory Visit (HOSPITAL_BASED_OUTPATIENT_CLINIC_OR_DEPARTMENT_OTHER): Payer: BC Managed Care – PPO

## 2016-01-06 VITALS — BP 129/78 | HR 54 | Temp 98.4°F | Resp 16 | Ht 62.0 in | Wt 233.8 lb

## 2016-01-06 DIAGNOSIS — N950 Postmenopausal bleeding: Secondary | ICD-10-CM | POA: Diagnosis not present

## 2016-01-06 DIAGNOSIS — D500 Iron deficiency anemia secondary to blood loss (chronic): Secondary | ICD-10-CM

## 2016-01-06 DIAGNOSIS — D509 Iron deficiency anemia, unspecified: Secondary | ICD-10-CM

## 2016-01-06 LAB — IRON AND TIBC
%SAT: 19 % — AB (ref 21–57)
Iron: 56 ug/dL (ref 41–142)
TIBC: 286 ug/dL (ref 236–444)
UIBC: 231 ug/dL (ref 120–384)

## 2016-01-06 LAB — CBC WITH DIFFERENTIAL (CANCER CENTER ONLY)
BASO#: 0 10*3/uL (ref 0.0–0.2)
BASO%: 0.5 % (ref 0.0–2.0)
EOS ABS: 0.3 10*3/uL (ref 0.0–0.5)
EOS%: 5.9 % (ref 0.0–7.0)
HCT: 38.2 % (ref 34.8–46.6)
HGB: 12.5 g/dL (ref 11.6–15.9)
LYMPH#: 1.5 10*3/uL (ref 0.9–3.3)
LYMPH%: 34.8 % (ref 14.0–48.0)
MCH: 27.5 pg (ref 26.0–34.0)
MCHC: 32.7 g/dL (ref 32.0–36.0)
MCV: 84 fL (ref 81–101)
MONO#: 0.4 10*3/uL (ref 0.1–0.9)
MONO%: 9.7 % (ref 0.0–13.0)
NEUT#: 2.2 10*3/uL (ref 1.5–6.5)
NEUT%: 49.1 % (ref 39.6–80.0)
PLATELETS: 328 10*3/uL (ref 145–400)
RBC: 4.55 10*6/uL (ref 3.70–5.32)
RDW: 15.2 % (ref 11.1–15.7)
WBC: 4.4 10*3/uL (ref 3.9–10.0)

## 2016-01-06 LAB — FERRITIN: Ferritin: 54 ng/ml (ref 9–269)

## 2016-01-06 NOTE — Progress Notes (Signed)
Hematology and Oncology Follow Up Visit  Angel Bailey [id number] [birth year] 50 y.o. 01/06/2016   Principle Diagnosis:   Iron deficiency anemia secondary to menometrorrhagia  Current Therapy:    Oral iron     Interim History:  Angel Bailey is back for follow-up. Since we last saw her, her iron has been doing well. She is taking iron supplements. She is feeling good.  She went to [place] back in June. She had a good time down in [place]. She is offered some marijuana but she refused.  She actually is renting her house out for the [employer]. She will be staying down in [place].  When we last saw her in May, her ferritin was 33 with iron saturation of 17%.  She has had no change in bowel or bladder habits. She does not have any mostly cycles now.  There is no cough or shortness of breath.  Overall, her performance status is ECOG 1.  Medications:  Current Outpatient Prescriptions:  .  Fe Fum-Fe Poly-Vit C-Lactobac (FUSION) 65-65-25-30 MG CAPS, Take by mouth., Disp: , Rfl:  .  Iron-FA-B Cmp-C-Biot-Probiotic (FUSION PLUS) CAPS, TAKE ONE CAPSULE BY MOUTH ONCE DAILY, Disp: 30 capsule, Rfl: 5 .  valACYclovir (VALTREX) 500 MG tablet, take 1 tablet by mouth once daily.  BID for 3 days with flare., Disp: 90 tablet, Rfl: 4 .  valsartan-hydrochlorothiazide (DIOVAN-HCT) 160-25 MG per tablet, Take 1 tablet by mouth every morning. , Disp: , Rfl:   Allergies:  Allergies  Allergen Reactions  . Penicillins Swelling    Has patient had a PCN reaction causing immediate rash, facial/tongue/throat swelling, SOB or lightheadedness with hypotension: yes Has patient had a PCN reaction causing severe rash involving mucus membranes or skin necrosis: Yes Has patient had a PCN reaction that required hospitalization No Has patient had a PCN reaction occurring within the last 10 years: No If all of the above answers are "NO", then may proceed with Cephalosporin use.     Past Medical History,  Surgical history, Social history, and Family History were reviewed and updated.  Review of Systems: As above  Physical Exam:  height is 5\' 2"  (1.575 m) and weight is 233 lb 12.8 oz (106.1 kg). Her oral temperature is 98.4 F (36.9 C). Her blood pressure is 129/78 and her pulse is 54 (abnormal). Her respiration is 16.   Wt Readings from Last 3 Encounters:  01/06/16 233 lb 12.8 oz (106.1 kg)  08/27/15 227 lb (103 kg)  07/12/15 227 lb (103 kg)     Head and neck exam shows no ocular or oral lesions. She has no palpable cervical or supraclavicular lymph nodes. Her conjunctiva are slightly pale. There is no adenopathy or palpable thyroid. Lungs are clear bilaterally. Cardiac exam regular rate and rhythm with no murmurs, rubs or bruits. Abdomen is soft. She is somewhat obese. She has no fluid wave. There is no palpable liver or spleen tip. Back exam shows no tenderness over the spine, ribs or hips. Extremity shows no clubbing, cyanosis or edema. She has good range of motion of her joints. Skin exam shows no rashes, ecchymoses or petechia. Neurological exam shows no focal neurological deficits.  Lab Results  Component Value Date   WBC 4.4 01/06/2016   HGB 12.5 01/06/2016   HCT 38.2 01/06/2016   MCV 84 01/06/2016   PLT 328 01/06/2016     Chemistry      Component Value Date/Time   NA 137 01/25/2015 1117   K 4.0  01/25/2015 1117   CL 103 01/25/2015 1117   CO2 28 01/25/2015 1117   BUN 15 01/25/2015 1117   CREATININE 1.05 01/25/2015 1117      Component Value Date/Time   CALCIUM 8.7 01/25/2015 1117         Impression and Plan: Angel Bailey is a 50 year old Afro-American female. She has iron deficiency anemia. Of note, we did do a hemoglobin electrophoresis on her. She has a normal hemoglobin quantity.  Her hemoglobin is much better. Her MCV is coming up. Her patelets are going down. This is all very consistent with resolution of her iron deficiency.  I think that we can let her go  from the clinic now. Everything is doing well.  She is just on oral iron. I'm just very happy for her. I'm sure that she will do well.  She knows that she can always come back if she has any problems.   Volanda Napoleon, MD 10/9/20178:41 AM

## 2016-01-07 ENCOUNTER — Telehealth: Payer: Self-pay | Admitting: Nurse Practitioner

## 2016-01-07 LAB — RETICULOCYTES: Reticulocyte Count: 1 % (ref 0.6–2.6)

## 2016-01-07 NOTE — Telephone Encounter (Addendum)
Informed patient of message and she verbalized appreciation. ----- Message from Volanda Napoleon, MD sent at 01/06/2016 12:04 PM EDT ----- Call - iron level continues to get better!!! Keep taking it!!  pete

## 2016-01-13 ENCOUNTER — Ambulatory Visit (INDEPENDENT_AMBULATORY_CARE_PROVIDER_SITE_OTHER): Payer: BC Managed Care – PPO | Admitting: Obstetrics & Gynecology

## 2016-01-13 ENCOUNTER — Encounter: Payer: Self-pay | Admitting: Obstetrics & Gynecology

## 2016-01-13 VITALS — BP 112/70 | HR 72 | Resp 16 | Ht 62.0 in | Wt 234.0 lb

## 2016-01-13 DIAGNOSIS — Z0000 Encounter for general adult medical examination without abnormal findings: Secondary | ICD-10-CM | POA: Diagnosis not present

## 2016-01-13 DIAGNOSIS — Z01419 Encounter for gynecological examination (general) (routine) without abnormal findings: Secondary | ICD-10-CM

## 2016-01-13 LAB — POCT URINALYSIS DIPSTICK
Bilirubin, UA: NEGATIVE
Glucose, UA: NEGATIVE
Ketones, UA: NEGATIVE
Leukocytes, UA: NEGATIVE
NITRITE UA: NEGATIVE
PH UA: 6
Protein, UA: NEGATIVE
RBC UA: NEGATIVE
UROBILINOGEN UA: NEGATIVE

## 2016-01-13 NOTE — Progress Notes (Signed)
50 y.o. G0P0 SingleAfrican AmericanF here for annual exam.  Doing well.  Still dating same person and very happy with this.  States "I'm happy and in love" and feels like this is why she has gained weight.    Denies vaginal bleeding.    Patient's last menstrual period was 08/24/2014 (exact date).          Sexually active: Yes.    The current method of family planning is status post hysterectomy.    Exercising: Yes.    elliptical, cardio, aerobics Smoker:  no  Health Maintenance: Pap:  04/10/13 negative, HR HPV negative  History of abnormal Pap:  yes MMG:  05/10/15 BIRADS 1 negative  Colonoscopy:  n/a BMD:   n/a TDaP:  UTD per patient Hep C testing: not indicated  Screening Labs: Burney Gauze, Urine today:not done   reports that she has never smoked. She has never used smokeless tobacco. She reports that she drinks alcohol. She reports that she does not use drugs.  Past Medical History:  Diagnosis Date  . H/O Fitz-Hugh-Curtis syndrome    ABDOMINAL ADHESIONS  . History of gonorrhea    2008  . History of herpes simplex type 2 infection   . History of HPV infection   . History of kidney stones   . History of ovarian cyst   . Hypertension   . Iron deficiency anemia 07/12/2015  . Leiomyoma of uterus   . Menometrorrhagia 07/12/2015    Past Surgical History:  Procedure Laterality Date  . ABDOMINAL HYSTERECTOMY N/A 01/21/2015   Procedure: HYSTERECTOMY ABDOMINAL;  Surgeon: Megan Salon, MD;  Location: [facility];  Service: Gynecology;  Laterality: N/A;  . BILATERAL SALPINGECTOMY Right 01/21/2015   Procedure: BILATERAL SALPINGECTOMY;  Surgeon: Megan Salon, MD;  Location: [facility];  Service: Gynecology;  Laterality: Right;  . CYSTO N/A 01/21/2015   Procedure: CYSTO;  Surgeon: Megan Salon, MD;  Location: [facility];  Service: Gynecology;  Laterality: N/A;  . CYSTO/  URETEROSCOPY LASER LITHOTRIPSY  STONE EXTRACTION  04-18-2009  . CYSTOSCOPY W/ URETERAL STENT PLACEMENT  12/ 2010  .  MYOMECTOMY  04-25-14  . ROBOTIC ASSISTED DIAGNOSTIC LAPAROSCOPY  12-31-2009   W/  LEFT SALPINGOOPHORECTOMY/  LEFT URETEROLYSIS/  EXTENSIVE LYSIS ADHESIONS  . TONSILLECTOMY  as child    Current Outpatient Prescriptions  Medication Sig Dispense Refill  . IRON PO Take by mouth daily.    . valACYclovir (VALTREX) 500 MG tablet take 1 tablet by mouth once daily.  BID for 3 days with flare. 90 tablet 4  . valsartan-hydrochlorothiazide (DIOVAN-HCT) 160-25 MG per tablet Take 1 tablet by mouth every morning.      No current facility-administered medications for this visit.     Family History  Problem Relation Age of Onset  . Hypertension Mother   . Diabetes Mother   . Hypertension Father   . Hypertension Maternal Grandmother     ROS:  Pertinent items are noted in HPI.  Otherwise, a comprehensive ROS was negative.  Exam:   BP 112/70 (BP Location: Right Arm, Patient Position: Sitting, Cuff Size: Normal)   Pulse 72   Resp 16   Ht 5\' 2"  (1.575 m)   Wt 234 lb (106.1 kg)   LMP 08/24/2014 (Exact Date)   BMI 42.80 kg/m   Weight change: +24#  Height: 5\' 2"  (157.5 cm)  Ht Readings from Last 3 Encounters:  01/13/16 5\' 2"  (1.575 m)  01/06/16 5\' 2"  (1.575 m)  08/27/15 5\' 2"  (1.575  m)    General appearance: alert, cooperative and appears stated age Head: Normocephalic, without obvious abnormality, atraumatic Neck: no adenopathy, supple, symmetrical, trachea midline and thyroid normal to inspection and palpation Lungs: clear to auscultation bilaterally Breasts: normal appearance, no masses or tenderness Heart: regular rate and rhythm Abdomen: soft, non-tender; bowel sounds normal; no masses,  no organomegaly Extremities: extremities normal, atraumatic, no cyanosis or edema Skin: Skin color, texture, turgor normal. No rashes or lesions Lymph nodes: Cervical, supraclavicular, and axillary nodes normal. No abnormal inguinal nodes palpated Neurologic: Grossly normal   Pelvic: External  genitalia:  no lesions              Urethra:  normal appearing urethra with no masses, tenderness or lesions              Bartholins and Skenes: normal                 Vagina: normal appearing vagina with normal color and discharge, no lesions              Cervix: absent              Pap taken: No. Bimanual Exam:  Uterus:  uterus absent              Adnexa: normal adnexa               Rectovaginal: Confirms               Anus:  normal sphincter tone, no lesions  Chaperone was present for exam.  A:  Well Woman with normal exam H/O robotic TLH/bilateral salpingectomy/cystoscopy H/O iron deficiency anemia, released from Dr. Marin Olp Hypertension  P:         Mammogram guidelines reviewed Pap not indicated Valtrex rx not needed Advised pt to stop iron.  Has been released from Dr. Marin Olp. Return annually or prn

## 2016-07-15 NOTE — Telephone Encounter (Signed)
Erroneous encounter

## 2016-09-04 ENCOUNTER — Other Ambulatory Visit: Payer: Self-pay | Admitting: Obstetrics & Gynecology

## 2016-09-04 NOTE — Telephone Encounter (Signed)
Medication refill request: Valacyclovir Last AEX:  01/13/16 SM Next AEX: 04/29/17 SM Last MMG (if hormonal medication request): 05/10/15 [facility] Refill authorized: 05/10/15

## 2016-12-15 ENCOUNTER — Ambulatory Visit
Admission: RE | Admit: 2016-12-15 | Discharge: 2016-12-15 | Disposition: A | Discharge status: Home / Self Care | Payer: BC Managed Care – PPO | Source: Ambulatory Visit | Attending: Obstetrics & Gynecology | Admitting: Obstetrics & Gynecology

## 2016-12-15 ENCOUNTER — Other Ambulatory Visit: Payer: Self-pay | Admitting: Obstetrics & Gynecology

## 2016-12-15 DIAGNOSIS — Z1231 Encounter for screening mammogram for malignant neoplasm of breast: Secondary | ICD-10-CM

## 2016-12-16 ENCOUNTER — Other Ambulatory Visit: Payer: Self-pay | Admitting: Obstetrics & Gynecology

## 2016-12-16 DIAGNOSIS — R928 Other abnormal and inconclusive findings on diagnostic imaging of breast: Secondary | ICD-10-CM

## 2016-12-22 ENCOUNTER — Ambulatory Visit
Admission: RE | Admit: 2016-12-22 | Discharge: 2016-12-22 | Disposition: A | Discharge status: Home / Self Care | Payer: BC Managed Care – PPO | Source: Ambulatory Visit | Attending: Obstetrics & Gynecology | Admitting: Obstetrics & Gynecology

## 2016-12-22 ENCOUNTER — Ambulatory Visit: Payer: BC Managed Care – PPO

## 2016-12-22 DIAGNOSIS — R928 Other abnormal and inconclusive findings on diagnostic imaging of breast: Secondary | ICD-10-CM

## 2017-04-29 ENCOUNTER — Other Ambulatory Visit: Payer: Self-pay

## 2017-04-29 ENCOUNTER — Ambulatory Visit: Payer: BC Managed Care – PPO | Admitting: Obstetrics & Gynecology

## 2017-04-29 ENCOUNTER — Encounter: Payer: Self-pay | Admitting: Obstetrics & Gynecology

## 2017-04-29 VITALS — BP 126/74 | HR 60 | Resp 12 | Ht 62.0 in | Wt 158.5 lb

## 2017-04-29 DIAGNOSIS — Z01419 Encounter for gynecological examination (general) (routine) without abnormal findings: Secondary | ICD-10-CM | POA: Diagnosis not present

## 2017-04-29 MED ORDER — VALACYCLOVIR HCL 500 MG PO TABS: ORAL_TABLET | ORAL | 4 refills | Status: DC

## 2017-04-29 NOTE — Progress Notes (Signed)
52 y.o. G0P0 SingleAfrican AmericanF here for annual exam.  Feels really good.  Work is good.  Still dating same person.  Younger sister got married.  Decided last year that she was going to lose 50 pounds at 51th birthday.  54 for 51 was her motto all last year.  Got braces about a year ago.  Denies vaginal bleeding.  Exercising: Yes.    walking, cardio, strength training  Smoker:  no  Health Maintenance: Pap:  02/08/14 Neg. HR HPV:neg   01/26/13 Neg. HR HPV:Neg  History of abnormal Pap:  Yes, HR HPV+ MMG:  12/22/16 BIRADS1:Neg  Colonoscopy:  04/05/17 normal. F/u 10 years  BMD:   never  TDaP:  2016 Pneumonia vaccine(s):  no Shingrix:   No  Hep C testing: not indicated  Screening Labs: done recently   reports that  has never smoked. she has never used smokeless tobacco. She reports that she drinks alcohol. She reports that she does not use drugs.  Past Medical History:  Diagnosis Date  . H/O Fitz-Hugh-Curtis syndrome    ABDOMINAL ADHESIONS  . History of gonorrhea    2008  . History of herpes simplex type 2 infection   . History of HPV infection   . History of kidney stones   . History of ovarian cyst   . Hypertension   . Iron deficiency anemia 07/12/2015  . Leiomyoma of uterus   . Menometrorrhagia 07/12/2015    Past Surgical History:  Procedure Laterality Date  . ABDOMINAL HYSTERECTOMY N/A 01/21/2015   Procedure: HYSTERECTOMY ABDOMINAL;  Surgeon: Megan Salon, MD;  Location: [facility];  Service: Gynecology;  Laterality: N/A;  . BILATERAL SALPINGECTOMY Right 01/21/2015   Procedure: BILATERAL SALPINGECTOMY;  Surgeon: Megan Salon, MD;  Location: [facility];  Service: Gynecology;  Laterality: Right;  . CYSTO N/A 01/21/2015   Procedure: CYSTO;  Surgeon: Megan Salon, MD;  Location: [facility];  Service: Gynecology;  Laterality: N/A;  . CYSTO/  URETEROSCOPY LASER LITHOTRIPSY  STONE EXTRACTION  04-18-2009  . CYSTOSCOPY W/ URETERAL STENT PLACEMENT  12/ 2010  . MYOMECTOMY  04-25-14  .  ROBOTIC ASSISTED DIAGNOSTIC LAPAROSCOPY  12-31-2009   W/  LEFT SALPINGOOPHORECTOMY/  LEFT URETEROLYSIS/  EXTENSIVE LYSIS ADHESIONS  . TONSILLECTOMY  as child    Current Outpatient Medications  Medication Sig Dispense Refill  . BANOPHEN 12.5 MG/5ML liquid     . chlorhexidine (PERIDEX) 0.12 % solution TAKE 1/2 OZ TWICE DAILY AND RINSE FOR 30 SECONDS  1  . hydrochlorothiazide (HYDRODIURIL) 25 MG tablet     . IRON PO Take by mouth daily.    . magic mouthwash SOLN SWISH APPROXIMATELY 1 OUNCE TWICE A DAY AFTER BRUSHING AND EXPECTORATE ALL  1  . olmesartan (BENICAR) 20 MG tablet     . valACYclovir (VALTREX) 500 MG tablet TAKE 1 TABLET BY MOUTH ONCE DAILY; TAKE TWICE DAILY FOR 3 DAYS WHEN FLARE OCCURS 90 tablet 2   No current facility-administered medications for this visit.     Family History  Problem Relation Age of Onset  . Hypertension Mother   . Diabetes Mother   . Hypertension Father   . Hypertension Maternal Grandmother     ROS:  Pertinent items are noted in HPI.  Otherwise, a comprehensive ROS was negative.  Exam:   BP 126/74 (BP Location: Right Arm, Patient Position: Sitting, Cuff Size: Normal)   Pulse 60   Resp 12   Ht 5\' 2"  (1.575 m)   Wt 158 lb  8 oz (71.9 kg)   LMP 08/24/2014 (Exact Date)   BMI 28.99 kg/m   Weight change: -70#  Height: 5\' 2"  (157.5 cm)  Ht Readings from Last 3 Encounters:  04/29/17 5\' 2"  (1.575 m)  01/13/16 5\' 2"  (1.575 m)  01/06/16 5\' 2"  (1.575 m)    General appearance: alert, cooperative and appears stated age Head: Normocephalic, without obvious abnormality, atraumatic Neck: no adenopathy, supple, symmetrical, trachea midline and thyroid normal to inspection and palpation Lungs: clear to auscultation bilaterally Breasts: normal appearance, no masses or tenderness Heart: regular rate and rhythm Abdomen: soft, non-tender; bowel sounds normal; no masses,  no organomegaly Extremities: extremities normal, atraumatic, no cyanosis or edema Skin:  Skin color, texture, turgor normal. No rashes or lesions Lymph nodes: Cervical, supraclavicular, and axillary nodes normal. No abnormal inguinal nodes palpated Neurologic: Grossly normal   Pelvic: External genitalia:  no lesions              Urethra:  normal appearing urethra with no masses, tenderness or lesions              Bartholins and Skenes: normal                 Vagina: normal appearing vagina with normal color and discharge, no lesions              Cervix: absent              Pap taken: No. Bimanual Exam:  Uterus:  uterus absent              Adnexa: no mass, fullness, tenderness               Rectovaginal: Confirms               Anus:  normal sphincter tone, no lesions  Chaperone was present for exam.  A:  Well Woman with normal exam H/O robotic TLH/bilateral salpingectomy/cystoscopy H/O iron deficiency anemia, released from Dr. Eyvonne Left Hypertension Intentional weight loss  P:   Mammogram guidelines reviewed.  Discussed 3D. pap smear not indicated No lab work obtained today Colonoscopy completed 1/19 Vaccines UTD Return annually or prn

## 2018-05-05 ENCOUNTER — Other Ambulatory Visit: Payer: Self-pay | Admitting: Obstetrics & Gynecology

## 2018-05-05 DIAGNOSIS — Z1231 Encounter for screening mammogram for malignant neoplasm of breast: Secondary | ICD-10-CM

## 2018-05-10 ENCOUNTER — Ambulatory Visit: Payer: BC Managed Care – PPO | Admitting: Obstetrics & Gynecology

## 2018-05-10 ENCOUNTER — Other Ambulatory Visit: Payer: Self-pay

## 2018-05-10 ENCOUNTER — Encounter: Payer: Self-pay | Admitting: Obstetrics & Gynecology

## 2018-05-10 ENCOUNTER — Encounter

## 2018-05-10 VITALS — BP 140/98 | HR 60 | Resp 16 | Ht 62.0 in | Wt 183.6 lb

## 2018-05-10 DIAGNOSIS — Z01419 Encounter for gynecological examination (general) (routine) without abnormal findings: Secondary | ICD-10-CM | POA: Diagnosis not present

## 2018-05-10 MED ORDER — VALACYCLOVIR HCL 500 MG PO TABS: ORAL_TABLET | ORAL | 4 refills | Status: DC

## 2018-05-10 NOTE — Patient Instructions (Signed)
[facility] [address] . [employer] , [place]: [phone] . Behavioral Medicine: [phone] . Fax: [phone]

## 2018-05-10 NOTE — Progress Notes (Signed)
53 y.o. G0P0 Single Black or [place] American female here for annual exam.  Doing well.  Denies vaginal bleeding.    Got braces off in December.    Broke up this past year.  He had an endoscopy that showed a polyp and a tumor just last week.  Pathology is pending.  This is adding some stressors to her life.    PCP:  Dr. Jeralene Huff.  Did blood work   Patient's last menstrual period was 08/24/2014 (exact date).          Sexually active: No.  The current method of family planning is post menopausal status.    Exercising: Yes.    cardio, weights Smoker:  no  Health Maintenance: Pap:  02/08/14 Neg. HR HPV:neg   01/26/13 Neg. HR HPV:neg  History of abnormal Pap:  Yes, HR HPV + 2013 MMG:  12/22/16 Diagnostic Left BIRADS1:Neg. Has appt 05/31/18 Colonoscopy:  04/05/17 normal. F/u 10 years  BMD:   never TDaP:  2016 Pneumonia vaccine(s):  n/a Shingrix:   Declines today Hep C testing: n/a Screening Labs: PCP   reports that she has never smoked. She has never used smokeless tobacco. She reports current alcohol use. She reports that she does not use drugs.  Past Medical History:  Diagnosis Date  . H/O Fitz-Hugh-Curtis syndrome    ABDOMINAL ADHESIONS  . History of gonorrhea    2008  . History of herpes simplex type 2 infection   . History of HPV infection   . History of kidney stones   . History of ovarian cyst   . Hypertension   . Iron deficiency anemia 07/12/2015  . Leiomyoma of uterus   . Menometrorrhagia 07/12/2015    Past Surgical History:  Procedure Laterality Date  . ABDOMINAL HYSTERECTOMY N/A 01/21/2015   Procedure: HYSTERECTOMY ABDOMINAL;  Surgeon: Megan Salon, MD;  Location: [facility];  Service: Gynecology;  Laterality: N/A;  . BILATERAL SALPINGECTOMY Right 01/21/2015   Procedure: BILATERAL SALPINGECTOMY;  Surgeon: Megan Salon, MD;  Location: [facility];  Service: Gynecology;  Laterality: Right;  . CYSTO N/A 01/21/2015   Procedure: CYSTO;  Surgeon: Megan Salon, MD;  Location: [facility];  Service: Gynecology;  Laterality: N/A;  . CYSTO/  URETEROSCOPY LASER LITHOTRIPSY  STONE EXTRACTION  04-18-2009  . CYSTOSCOPY W/ URETERAL STENT PLACEMENT  12/ 2010  . MYOMECTOMY  04-25-14  . ROBOTIC ASSISTED DIAGNOSTIC LAPAROSCOPY  12-31-2009   W/  LEFT SALPINGOOPHORECTOMY/  LEFT URETEROLYSIS/  EXTENSIVE LYSIS ADHESIONS  . TONSILLECTOMY  as child    Current Outpatient Medications  Medication Sig Dispense Refill  . valACYclovir (VALTREX) 500 MG tablet TAKE 1 TABLET BY MOUTH ONCE DAILY; TAKE TWICE DAILY FOR 3 DAYS WHEN FLARE OCCURS 90 tablet 4  . valsartan-hydrochlorothiazide (DIOVAN-HCT) 160-25 MG tablet Take 1 tablet by mouth daily.     No current facility-administered medications for this visit.     Family History  Problem Relation Age of Onset  . Hypertension Mother   . Diabetes Mother   . Hypertension Father   . Hypertension Maternal Grandmother     Review of Systems  All other systems reviewed and are negative.   Exam:   BP (!) 140/98 (BP Location: Right Arm, Patient Position: Sitting, Cuff Size: Large)   Pulse 60   Resp 16   Ht 5\' 2"  (1.575 m)   Wt 183 lb 9.6 oz (83.3 kg)   LMP 08/24/2014 (Exact Date)   BMI 33.58 kg/m   Height:  Height: 5\' 2"  (157.5 cm)  Ht Readings from Last 3 Encounters:  05/10/18 5\' 2"  (1.575 m)  04/29/17 5\' 2"  (1.575 m)  01/13/16 5\' 2"  (1.575 m)    General appearance: alert, cooperative and appears stated age Head: Normocephalic, without obvious abnormality, atraumatic Neck: no adenopathy, supple, symmetrical, trachea midline and thyroid normal to inspection and palpation Lungs: clear to auscultation bilaterally Breasts: normal appearance, no masses or tenderness Heart: regular rate and rhythm Abdomen: soft, non-tender; bowel sounds normal; no masses,  no organomegaly Extremities: extremities normal, atraumatic, no cyanosis or edema Skin: Skin color, texture, turgor normal. No rashes or lesions Lymph nodes: Cervical, supraclavicular,  and axillary nodes normal. No abnormal inguinal nodes palpated Neurologic: Grossly normal  Pelvic: External genitalia:  no lesions              Urethra:  normal appearing urethra with no masses, tenderness or lesions              Bartholins and Skenes: normal                 Vagina: normal appearing vagina with normal color and discharge, no lesions              Cervix: absent              Pap taken: No. Bimanual Exam:  Uterus:  uterus absent              Adnexa: no mass, fullness, tenderness               Rectovaginal: Confirms               Anus:  normal sphincter tone, no lesions  Chaperone was present for exam.  A:  Well Woman with normal exam H/O robotics TLH/bilateral salpingectomy/cystoscopy H/O iron deficiency anemia, resolved after hysterectomy Hypertension  P:   Mammogram guidelines reviewed pap smear not indicated Lab work done with Dr. Jeralene Huff D/w pt shingrix vaccination Rx for Valtrex to pharmacy.  Taking just prn at this time. Return annually or prn

## 2018-05-31 ENCOUNTER — Ambulatory Visit: Payer: BC Managed Care – PPO

## 2018-06-03 ENCOUNTER — Ambulatory Visit
Admission: RE | Admit: 2018-06-03 | Discharge: 2018-06-03 | Disposition: A | Discharge status: Home / Self Care | Payer: BC Managed Care – PPO | Source: Ambulatory Visit | Attending: Obstetrics & Gynecology | Admitting: Obstetrics & Gynecology

## 2018-06-03 DIAGNOSIS — Z1231 Encounter for screening mammogram for malignant neoplasm of breast: Secondary | ICD-10-CM

## 2018-07-15 ENCOUNTER — Ambulatory Visit: Payer: BC Managed Care – PPO | Admitting: Obstetrics & Gynecology

## 2019-01-06 ENCOUNTER — Telehealth: Payer: Self-pay | Admitting: Obstetrics & Gynecology

## 2019-01-06 NOTE — Telephone Encounter (Signed)
  Called patient and notified her that rx has been sent to [facility] [place].

## 2019-01-06 NOTE — Telephone Encounter (Signed)
Patient is requesting a refill of [facility]trex to [facility] [place], [place]

## 2019-05-10 ENCOUNTER — Other Ambulatory Visit: Payer: Self-pay

## 2019-05-12 ENCOUNTER — Other Ambulatory Visit: Payer: Self-pay

## 2019-05-12 ENCOUNTER — Encounter: Payer: Self-pay | Admitting: Obstetrics & Gynecology

## 2019-05-12 ENCOUNTER — Ambulatory Visit: Payer: BC Managed Care – PPO | Admitting: Obstetrics & Gynecology

## 2019-05-12 VITALS — BP 110/60 | HR 68 | Temp 97.1°F | Resp 10 | Ht 61.5 in | Wt 198.0 lb

## 2019-05-12 DIAGNOSIS — Z113 Encounter for screening for infections with a predominantly sexual mode of transmission: Secondary | ICD-10-CM | POA: Diagnosis not present

## 2019-05-12 DIAGNOSIS — Z01419 Encounter for gynecological examination (general) (routine) without abnormal findings: Secondary | ICD-10-CM

## 2019-05-12 MED ORDER — VALACYCLOVIR HCL 500 MG PO TABS: ORAL_TABLET | ORAL | 4 refills | Status: DC

## 2019-05-12 NOTE — Progress Notes (Signed)
54 y.o. G0P0 Single Black or [place] American female here for annual exam.  She and boyfriend broke up after 3 years.  She is seeing someone new.     Patient's last menstrual period was 08/24/2014 (exact date).          Sexually active: No.  Last SA 2 weeks ago The current method of family planning is hysterectomy    Exercising: Yes.    cardio, strength training, stationary bike Smoker:  no  Health Maintenance: Pap:    02/08/14 Neg. HR HPV:neg              01/26/13 Neg. HR HPV:neg  History of abnormal Pap:  Yes, HR HPV + 2013 MMG:  06/03/18 BIRADS 1 negative/density c Colonoscopy:  04/05/17 normal. F/u 10 years  BMD:   never TDaP:  2016 Pneumonia vaccine(s):  n/a Shingrix:   no Hep C testing: n/a Screening Labs: PCP   reports that she has never smoked. She has never used smokeless tobacco. She reports current alcohol use. She reports that she does not use drugs.  Past Medical History:  Diagnosis Date  . H/O Fitz-Hugh-Curtis syndrome    ABDOMINAL ADHESIONS  . History of gonorrhea    2008  . History of herpes simplex type 2 infection   . History of HPV infection   . History of kidney stones   . History of ovarian cyst   . Hypertension   . Iron deficiency anemia 07/12/2015  . Leiomyoma of uterus   . Menometrorrhagia 07/12/2015    Past Surgical History:  Procedure Laterality Date  . ABDOMINAL HYSTERECTOMY N/A 01/21/2015   Procedure: HYSTERECTOMY ABDOMINAL;  Surgeon: Megan Salon, MD;  Location: [facility];  Service: Gynecology;  Laterality: N/A;  . BILATERAL SALPINGECTOMY Right 01/21/2015   Procedure: BILATERAL SALPINGECTOMY;  Surgeon: Megan Salon, MD;  Location: [facility];  Service: Gynecology;  Laterality: Right;  . CYSTO N/A 01/21/2015   Procedure: CYSTO;  Surgeon: Megan Salon, MD;  Location: [facility];  Service: Gynecology;  Laterality: N/A;  . CYSTO/  URETEROSCOPY LASER LITHOTRIPSY  STONE EXTRACTION  04-18-2009  . CYSTOSCOPY W/ URETERAL STENT PLACEMENT  12/ 2010  . MYOMECTOMY   04-25-14  . ROBOTIC ASSISTED DIAGNOSTIC LAPAROSCOPY  12-31-2009   W/  LEFT SALPINGOOPHORECTOMY/  LEFT URETEROLYSIS/  EXTENSIVE LYSIS ADHESIONS  . TONSILLECTOMY  as child    Current Outpatient Medications  Medication Sig Dispense Refill  . valACYclovir (VALTREX) 500 MG tablet TAKE 1 TABLET BY MOUTH ONCE DAILY; TAKE TWICE DAILY FOR 3 DAYS WHEN FLARE OCCURS 90 tablet 4  . valsartan-hydrochlorothiazide (DIOVAN-HCT) 160-25 MG tablet Take 1 tablet by mouth daily.     No current facility-administered medications for this visit.    Family History  Problem Relation Age of Onset  . Hypertension Mother   . Diabetes Mother   . Hypertension Father   . Hypertension Maternal Grandmother     Review of Systems  All other systems reviewed and are negative.   Exam:   BP 110/60 (BP Location: Right Arm, Patient Position: Sitting, Cuff Size: Large)   Pulse 68   Temp (!) 97.1 F (36.2 C) (Temporal)   Resp 10   Ht 5' 1.5" (1.562 m)   Wt 198 lb (89.8 kg)   LMP 08/24/2014 (Exact Date)   BMI 36.81 kg/m    Height: 5' 1.5" (156.2 cm)  Ht Readings from Last 3 Encounters:  05/12/19 5' 1.5" (1.562 m)  05/10/18 5\' 2"  (1.575 m)  04/29/17 5\' 2"  (1.575 m)    General appearance: alert, cooperative and appears stated age Head: Normocephalic, without obvious abnormality, atraumatic Neck: no adenopathy, supple, symmetrical, trachea midline and thyroid normal to inspection and palpation Lungs: clear to auscultation bilaterally Breasts: normal appearance, no masses or tenderness Heart: regular rate and rhythm Abdomen: soft, non-tender; bowel sounds normal; no masses,  no organomegaly Extremities: extremities normal, atraumatic, no cyanosis or edema Skin: Skin color, texture, turgor normal. No rashes or lesions Lymph nodes: Cervical, supraclavicular, and axillary nodes normal. No abnormal inguinal nodes palpated Neurologic: Grossly normal   Pelvic: External genitalia:  no lesions              Urethra:   normal appearing urethra with no masses, tenderness or lesions              Bartholins and Skenes: normal                 Vagina: normal appearing vagina with normal color and discharge, no lesions              Cervix: absent              Pap taken: No. Bimanual Exam:  Uterus:  uterus absent              Adnexa: no mass, fullness, tenderness               Rectovaginal: Confirms               Anus:  normal sphincter tone, no lesions  Chaperone, Terence Lux, CMA, was present for exam.  A:  Well Woman with normal exam H/o robotic TLH/bilateral salpingectomy/cystoscopy 10/16 H/o iron deficiency anemia, resolved after hysterectomy Hypertension  P:   Mammogram guidelines reviewed pap smear not indicated Lab work done with Dr. Jeralene Huff Declines shingles vaccine at this time RF for valtrex to pharmacy.  Valtrex 500mg  daily then bid x 3 days.  #90/4RF HIV, RPR, Hep B and C Return annually or prn

## 2019-05-14 LAB — HEPATITIS C ANTIBODY: Hep C Virus Ab: <0.1 s/co ratio (ref 0.0–0.9)

## 2019-05-14 LAB — HEP, RPR, HIV PANEL
HIV Screen 4th Generation wRfx: NONREACTIVE
Hepatitis B Surface Ag: NEGATIVE
RPR Ser Ql: NONREACTIVE

## 2019-05-15 LAB — GC/CHLAMYDIA PROBE AMP
Chlamydia trachomatis, NAA: NEGATIVE
Neisseria Gonorrhoeae by PCR: NEGATIVE

## 2019-09-12 ENCOUNTER — Ambulatory Visit: Payer: BC Managed Care – PPO | Admitting: Obstetrics & Gynecology

## 2019-09-23 IMAGING — MG DIGITAL SCREENING BILATERAL MAMMOGRAM WITH TOMO AND CAD
6 of 10 series · 6 of 30 positions shown · non-contrast
Comparison: Previous exam(s).

CLINICAL DATA: Screening.

EXAM:
DIGITAL SCREENING BILATERAL MAMMOGRAM WITH TOMO AND CAD

[R CC synth-2D]
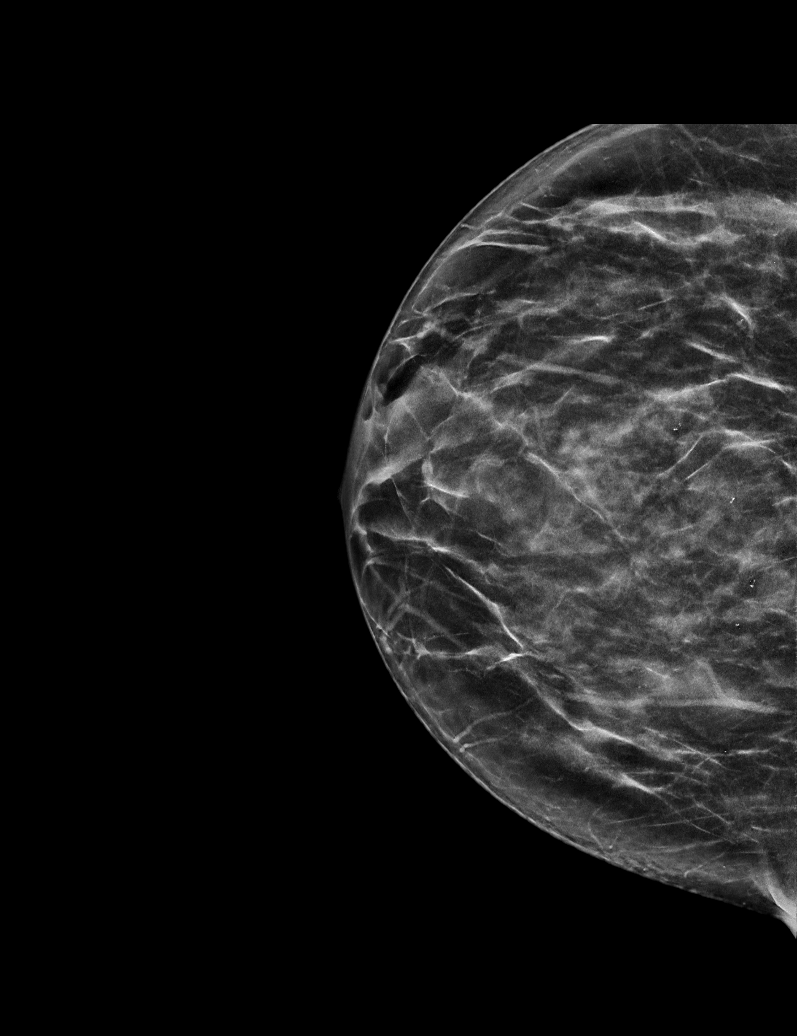

[L MLO synth-2D]
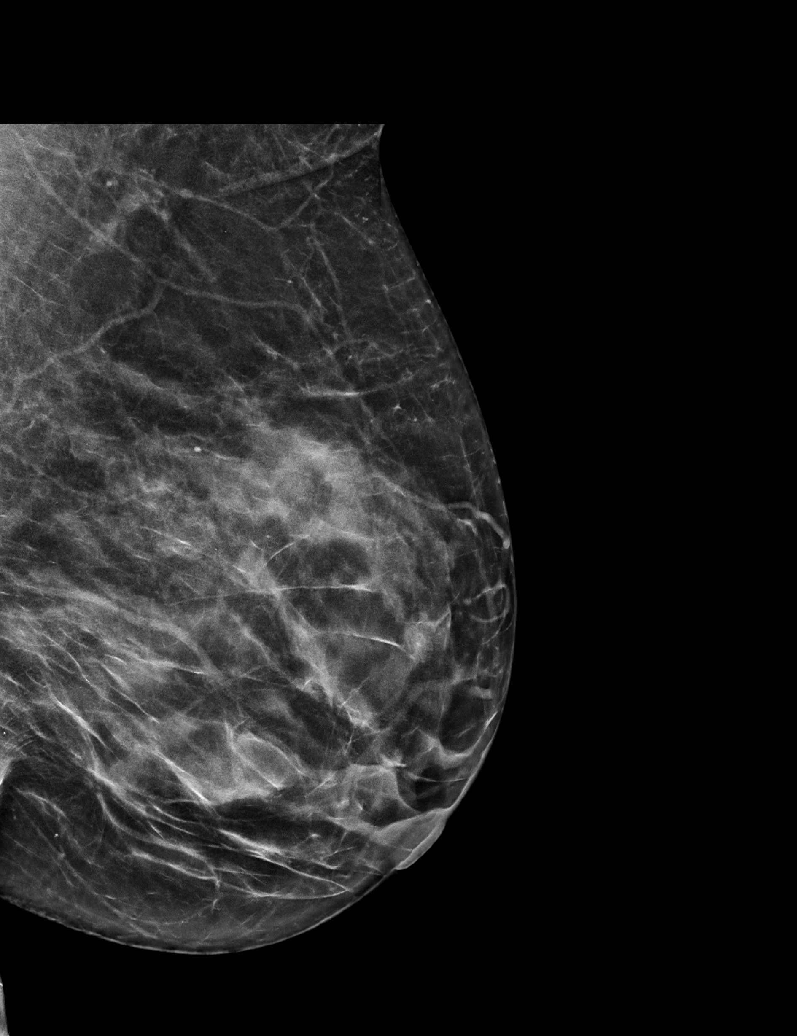

[L CC synth-2D]
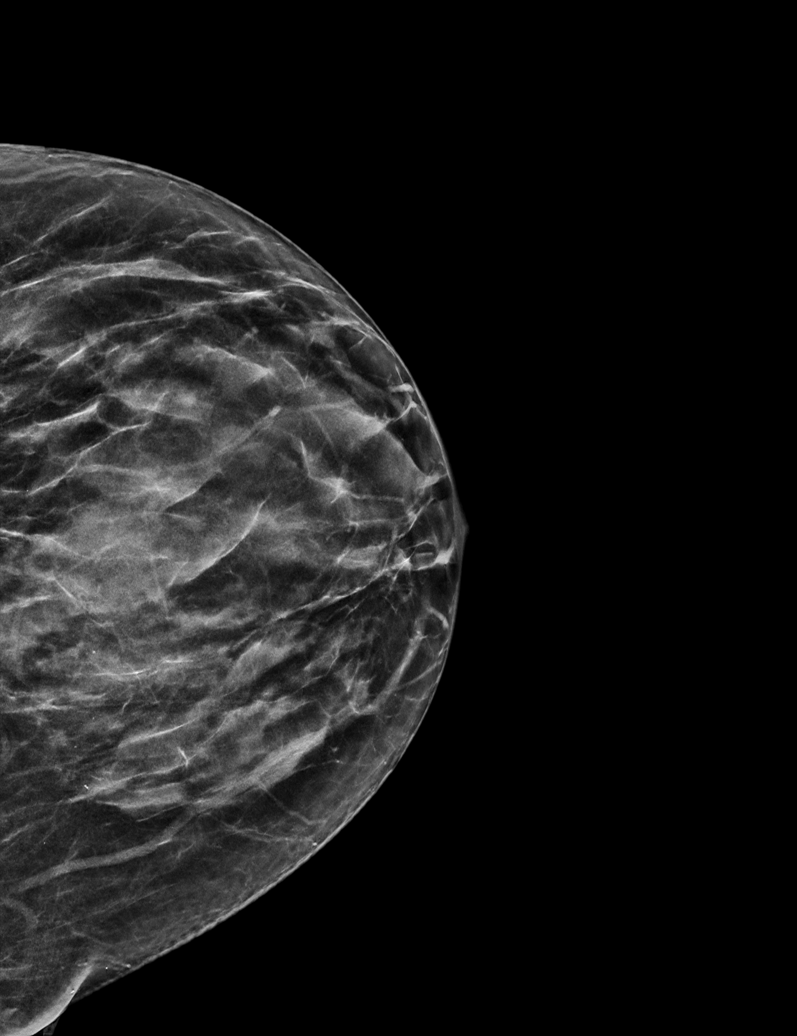

[R MLO synth-2D (1 of 2)]
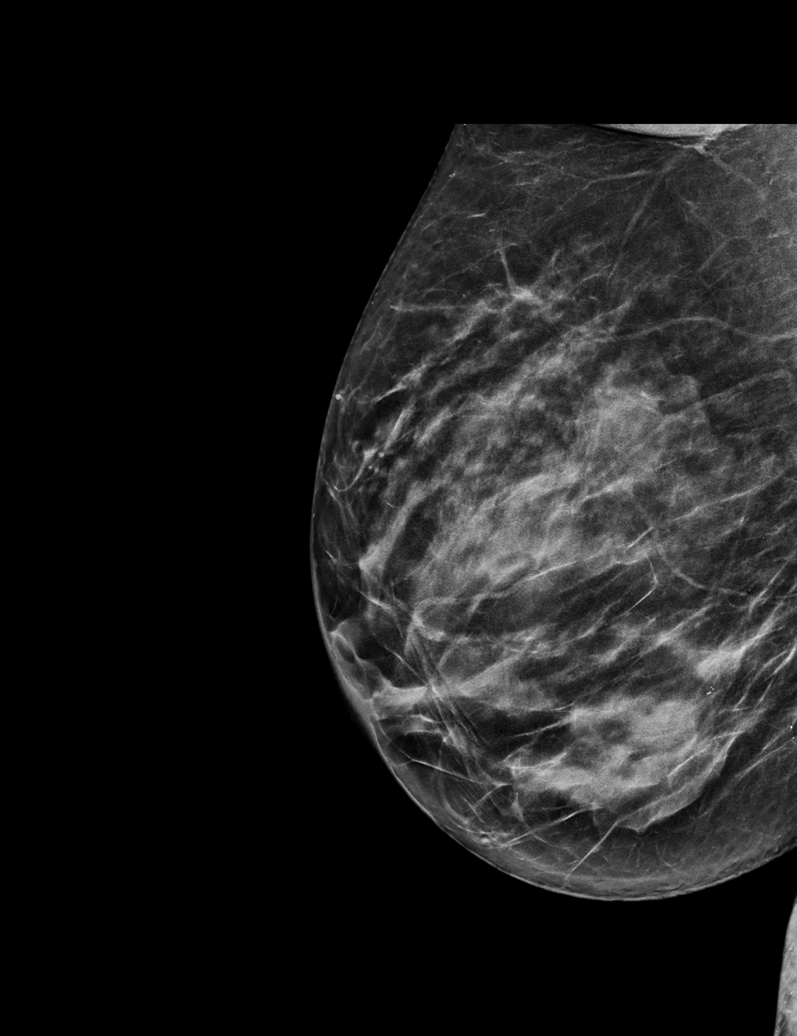

[R MLO synth-2D (2 of 2)]
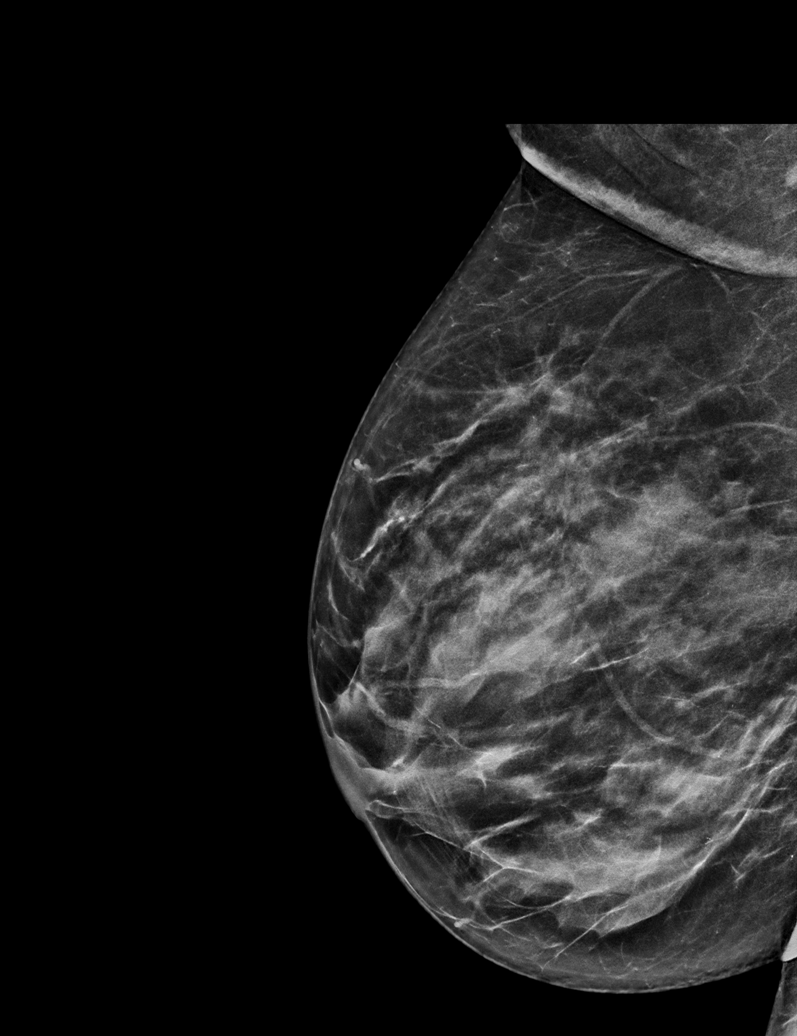

[R MLO tomo · tomo slice 30/59.0]
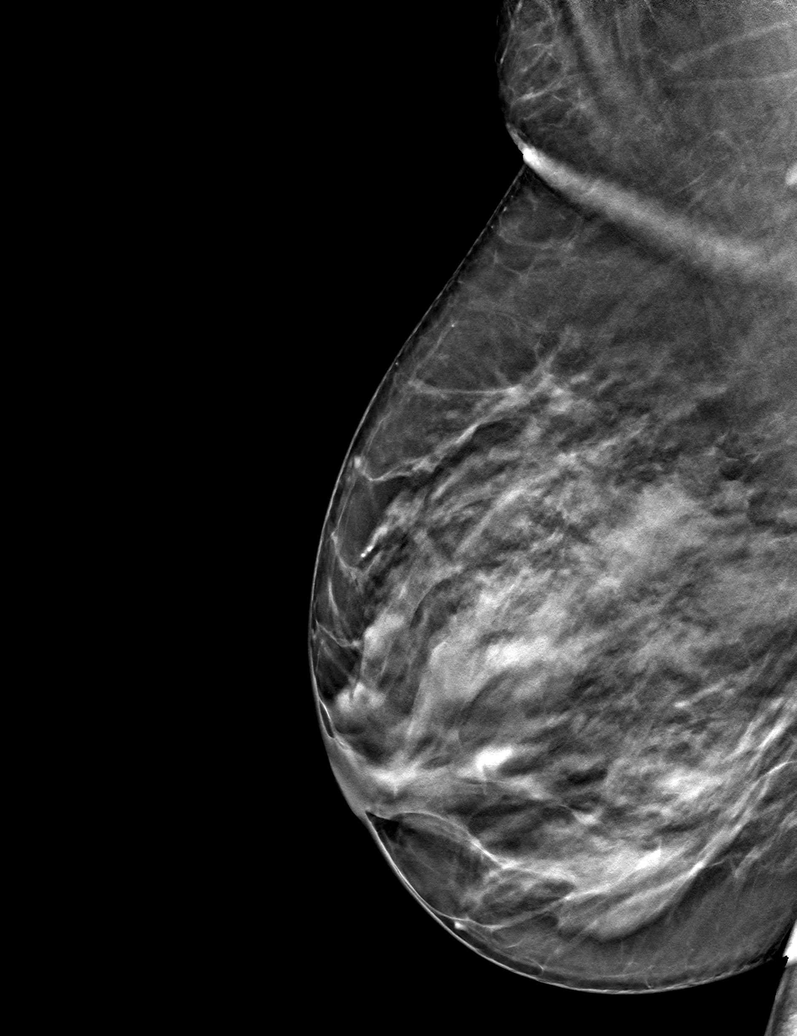

[6 of 30 positions shown; findings below may reference images not displayed]

ACR Breast Density Category c: The breast tissue is heterogeneously
dense, which may obscure small masses.
FINDINGS: There are no findings suspicious for malignancy. Images were
processed with CAD.
IMPRESSION: No mammographic evidence of malignancy. A result letter of this
screening mammogram will be mailed directly to the patient.

RECOMMENDATION:
Screening mammogram in one year. (Code:FT-U-LHB)

BI-RADS CATEGORY  1: Negative.

## 2020-04-30 ENCOUNTER — Other Ambulatory Visit: Payer: Self-pay | Admitting: Physician Assistant

## 2020-04-30 DIAGNOSIS — Z1231 Encounter for screening mammogram for malignant neoplasm of breast: Secondary | ICD-10-CM

## 2020-07-08 ENCOUNTER — Other Ambulatory Visit: Payer: Self-pay

## 2020-07-08 ENCOUNTER — Inpatient Hospital Stay: Admission: RE | Admit: 2020-07-08 | Payer: BC Managed Care – PPO | Source: Ambulatory Visit

## 2020-07-08 ENCOUNTER — Ambulatory Visit
Admission: RE | Admit: 2020-07-08 | Discharge: 2020-07-08 | Disposition: A | Discharge status: Home / Self Care | Payer: Self-pay | Source: Ambulatory Visit | Attending: Internal Medicine | Admitting: Internal Medicine

## 2020-07-08 DIAGNOSIS — Z1231 Encounter for screening mammogram for malignant neoplasm of breast: Secondary | ICD-10-CM

## 2020-08-09 ENCOUNTER — Ambulatory Visit: Payer: BC Managed Care – PPO

## 2022-12-30 ENCOUNTER — Other Ambulatory Visit: Payer: Self-pay | Admitting: Physician Assistant

## 2022-12-30 DIAGNOSIS — Z1231 Encounter for screening mammogram for malignant neoplasm of breast: Secondary | ICD-10-CM

## 2023-01-26 ENCOUNTER — Ambulatory Visit
Admission: RE | Admit: 2023-01-26 | Discharge: 2023-01-26 | Disposition: A | Discharge status: Home / Self Care | Payer: BC Managed Care – PPO | Source: Ambulatory Visit | Attending: Physician Assistant | Admitting: Physician Assistant

## 2023-01-26 DIAGNOSIS — Z1231 Encounter for screening mammogram for malignant neoplasm of breast: Secondary | ICD-10-CM

## 2023-01-29 ENCOUNTER — Other Ambulatory Visit: Payer: Self-pay | Admitting: Physician Assistant

## 2023-01-29 DIAGNOSIS — R928 Other abnormal and inconclusive findings on diagnostic imaging of breast: Secondary | ICD-10-CM

## 2023-02-02 ENCOUNTER — Ambulatory Visit
Admission: RE | Admit: 2023-02-02 | Discharge: 2023-02-02 | Disposition: A | Discharge status: Home / Self Care | Payer: BC Managed Care – PPO | Source: Ambulatory Visit | Attending: Physician Assistant | Admitting: Physician Assistant

## 2023-02-02 ENCOUNTER — Other Ambulatory Visit: Payer: Self-pay | Admitting: Physician Assistant

## 2023-02-02 ENCOUNTER — Inpatient Hospital Stay
Admission: RE | Admit: 2023-02-02 | Discharge: 2023-02-02 | Discharge status: Home / Self Care | Payer: BC Managed Care – PPO | Source: Ambulatory Visit | Attending: Physician Assistant | Admitting: Physician Assistant

## 2023-02-02 DIAGNOSIS — R928 Other abnormal and inconclusive findings on diagnostic imaging of breast: Secondary | ICD-10-CM

## 2023-02-02 DIAGNOSIS — N6489 Other specified disorders of breast: Secondary | ICD-10-CM

## 2023-02-08 ENCOUNTER — Ambulatory Visit
Admission: RE | Admit: 2023-02-08 | Discharge: 2023-02-08 | Disposition: A | Discharge status: Home / Self Care | Payer: BC Managed Care – PPO | Source: Ambulatory Visit | Attending: Physician Assistant | Admitting: Physician Assistant

## 2023-02-08 DIAGNOSIS — R928 Other abnormal and inconclusive findings on diagnostic imaging of breast: Secondary | ICD-10-CM

## 2023-02-08 DIAGNOSIS — N6489 Other specified disorders of breast: Secondary | ICD-10-CM

## 2023-02-08 HISTORY — PX: BREAST BIOPSY: SHX20

## 2023-02-09 LAB — SURGICAL PATHOLOGY

## 2023-02-18 ENCOUNTER — Ambulatory Visit: Payer: Self-pay | Admitting: General Surgery

## 2023-02-18 DIAGNOSIS — N6489 Other specified disorders of breast: Secondary | ICD-10-CM

## 2023-02-18 MED ORDER — KETOROLAC TROMETHAMINE 15 MG/ML IJ SOLN: 15.0000 mg | INTRAMUSCULAR | Status: AC

## 2023-02-19 ENCOUNTER — Other Ambulatory Visit: Payer: Self-pay | Admitting: General Surgery

## 2023-02-19 DIAGNOSIS — N6489 Other specified disorders of breast: Secondary | ICD-10-CM

## 2023-03-10 NOTE — Pre-Procedure Instructions (Signed)
Surgical Instructions   Your procedure is scheduled on March 17, 2023. Report to [facility] Main Entrance "A" at 9:30 A.M., then check in with the Admitting office. Any questions or running late day of surgery: call [phone]  Questions prior to your surgery date: call [phone], Monday-Friday, 8am-4pm. If you experience any cold or flu symptoms such as cough, fever, chills, shortness of breath, etc. between now and your scheduled surgery, please notify us at the above number.     Remember:  Do not eat after midnight the night before your surgery   You may drink clear liquids until 8:30 AM the morning of your surgery.   Clear liquids allowed are: Water, Non-Citrus Juices (without pulp), Carbonated Beverages, Clear Tea (no milk, honey, etc.), Black Coffee Only (NO MILK, CREAM OR POWDERED CREAMER of any kind), and Gatorade.    Take these medicines the morning of surgery with A SIP OF WATER: valACYclovir (VALTREX) - may take if needed   One week prior to surgery, STOP taking any Aspirin (unless otherwise instructed by your surgeon) Aleve, Naproxen, Ibuprofen, Motrin, Advil, Goody's, BC's, all herbal medications, fish oil, and non-prescription vitamins.                     Do NOT Smoke (Tobacco/Vaping) for 24 hours prior to your procedure.  If you use a CPAP at night, you may bring your mask/headgear for your overnight stay.   You will be asked to remove any contacts, glasses, piercing's, hearing aid's, dentures/partials prior to surgery. Please bring cases for these items if needed.    Patients discharged the day of surgery will not be allowed to drive home, and someone needs to stay with them for 24 hours.  SURGICAL WAITING ROOM VISITATION Patients may have no more than 2 support people in the waiting area - these visitors may rotate.   Pre-op nurse will coordinate an appropriate time for 1 ADULT support person, who may not rotate, to accompany patient in pre-op.  Children  under the age of 49 must have an adult with them who is not the patient and must remain in the main waiting area with an adult.  If the patient needs to stay at the hospital during part of their recovery, the visitor guidelines for inpatient rooms apply.  Please refer to the [facility] website for the visitor guidelines for any additional information.   If you received a COVID test during your pre-op visit  it is requested that you wear a mask when out in public, stay away from anyone that may not be feeling well and notify your surgeon if you develop symptoms. If you have been in contact with anyone that has tested positive in the last 10 days please notify you surgeon.      Pre-operative CHG Bathing Instructions   You can play a key role in reducing the risk of infection after surgery. Your skin needs to be as free of germs as possible. You can reduce the number of germs on your skin by washing with CHG (chlorhexidine gluconate) soap before surgery. CHG is an antiseptic soap that kills germs and continues to kill germs even after washing.   DO NOT use if you have an allergy to chlorhexidine/CHG or antibacterial soaps. If your skin becomes reddened or irritated, stop using the CHG and notify one of our RNs at [phone].              TAKE A SHOWER THE NIGHT BEFORE  SURGERY AND THE DAY OF SURGERY    Please keep in mind the following:  DO NOT shave, including legs and underarms, 48 hours prior to surgery.   You may shave your face before/day of surgery.  Place clean sheets on your bed the night before surgery Use a clean washcloth (not used since being washed) for each shower. DO NOT sleep with pet's night before surgery.  CHG Shower Instructions:  Wash your face and private area with normal soap. If you choose to wash your hair, wash first with your normal shampoo.  After you use shampoo/soap, rinse your hair and body thoroughly to remove shampoo/soap residue.  Turn the water OFF and  apply half the bottle of CHG soap to a CLEAN washcloth.  Apply CHG soap ONLY FROM YOUR NECK DOWN TO YOUR TOES (washing for 3-5 minutes)  DO NOT use CHG soap on face, private areas, open wounds, or sores.  Pay special attention to the area where your surgery is being performed.  If you are having back surgery, having someone wash your back for you may be helpful. Wait 2 minutes after CHG soap is applied, then you may rinse off the CHG soap.  Pat dry with a clean towel  Put on clean pajamas    Additional instructions for the day of surgery: DO NOT APPLY any lotions, deodorants, cologne, or perfumes.   Do not wear jewelry or makeup Do not wear nail polish, gel polish, artificial nails, or any other type of covering on natural nails (fingers and toes) Do not bring valuables to the hospital. [facility] is not responsible for valuables/personal belongings. Put on clean/comfortable clothes.  Please brush your teeth.  Ask your nurse before applying any prescription medications to the skin.

## 2023-03-11 ENCOUNTER — Encounter (HOSPITAL_COMMUNITY)
Admission: RE | Admit: 2023-03-11 | Discharge: 2023-03-11 | Disposition: A | Discharge status: Home / Self Care | Payer: BC Managed Care – PPO | Source: Ambulatory Visit | Attending: General Surgery | Admitting: General Surgery

## 2023-03-11 ENCOUNTER — Encounter (HOSPITAL_COMMUNITY): Payer: Self-pay

## 2023-03-11 ENCOUNTER — Other Ambulatory Visit: Payer: Self-pay

## 2023-03-11 VITALS — BP 135/86 | HR 87 | Temp 98.6°F | Resp 20 | Ht 62.0 in | Wt 252.5 lb

## 2023-03-11 DIAGNOSIS — Z01818 Encounter for other preprocedural examination: Secondary | ICD-10-CM | POA: Insufficient documentation

## 2023-03-11 DIAGNOSIS — I2510 Atherosclerotic heart disease of native coronary artery without angina pectoris: Secondary | ICD-10-CM | POA: Insufficient documentation

## 2023-03-11 HISTORY — DX: Prediabetes: R73.03

## 2023-03-11 HISTORY — DX: Unspecified osteoarthritis, unspecified site: M19.90

## 2023-03-11 LAB — CBC
HCT: 39 % (ref 36.0–46.0)
Hemoglobin: 12.2 g/dL (ref 12.0–15.0)
MCH: 26.2 pg (ref 26.0–34.0)
MCHC: 31.3 g/dL (ref 30.0–36.0)
MCV: 83.7 fL (ref 80.0–100.0)
Platelets: 338 10*3/uL (ref 150–400)
RBC: 4.66 MIL/uL (ref 3.87–5.11)
RDW: 15.1 % (ref 11.5–15.5)
WBC: 4.4 10*3/uL (ref 4.0–10.5)
nRBC: 0 % (ref 0.0–0.2)

## 2023-03-11 LAB — BASIC METABOLIC PANEL
Anion gap: 8 (ref 5–15)
BUN: 18 mg/dL (ref 6–20)
CO2: 28 mmol/L (ref 22–32)
Calcium: 9.1 mg/dL (ref 8.9–10.3)
Chloride: 105 mmol/L (ref 98–111)
Creatinine, Ser: 1.13 mg/dL — ABNORMAL HIGH (ref 0.44–1.00)
GFR, Estimated: 57 mL/min — ABNORMAL LOW (ref >60)
Glucose, Bld: 107 mg/dL — ABNORMAL HIGH (ref 70–99)
Potassium: 3.8 mmol/L (ref 3.5–5.1)
Sodium: 141 mmol/L (ref 135–145)

## 2023-03-11 NOTE — Progress Notes (Addendum)
PCP - Cyprus Buckley, PA-C Cardiologist - Denies  PPM/ICD - Denies Device Orders - n/a Rep Notified - n/a  Chest x-ray - Denies EKG - 03/11/2023 Stress Test - Denies ECHO - Denies Cardiac Cath - Denies  Sleep Study - Denies CPAP - n/a  Pt is Pre-DM  Last dose of GLP1 agonist- n/a GLP1 instructions: n/a  Blood Thinner Instructions: n/a Aspirin Instructions: n/a  ERAS Protcol - Clear liquids until 0830 morning of surgery PRE-SURGERY Ensure or G2- n/a  COVID TEST- n/a   Anesthesia review: Yes. Breast seed placement. EKG review.  Patient denies shortness of breath, fever, cough and chest pain at PAT appointment. Pt endorses a minor cold early November. She has had symptom resolution for greater than 4 weeks. No other respiratory illness/infection.   All instructions explained to the patient, with a verbal understanding of the material. Patient agrees to go over the instructions while at home for a better understanding. Patient also instructed to self quarantine after being tested for COVID-19. The opportunity to ask questions was provided.

## 2023-03-16 ENCOUNTER — Ambulatory Visit
Admission: RE | Admit: 2023-03-16 | Discharge: 2023-03-16 | Disposition: A | Discharge status: Home / Self Care | Payer: BC Managed Care – PPO | Source: Ambulatory Visit | Attending: General Surgery | Admitting: General Surgery

## 2023-03-16 DIAGNOSIS — N6489 Other specified disorders of breast: Secondary | ICD-10-CM

## 2023-03-16 HISTORY — PX: BREAST BIOPSY: SHX20

## 2023-03-17 ENCOUNTER — Ambulatory Visit (HOSPITAL_COMMUNITY)
Admission: RE | Admit: 2023-03-17 | Discharge: 2023-03-17 | Disposition: A | Discharge status: Home / Self Care | Payer: BC Managed Care – PPO | Attending: General Surgery | Admitting: General Surgery

## 2023-03-17 ENCOUNTER — Ambulatory Visit (HOSPITAL_COMMUNITY): Payer: BC Managed Care – PPO | Admitting: Anesthesiology

## 2023-03-17 ENCOUNTER — Other Ambulatory Visit: Payer: Self-pay

## 2023-03-17 ENCOUNTER — Other Ambulatory Visit (HOSPITAL_COMMUNITY): Payer: Self-pay

## 2023-03-17 ENCOUNTER — Encounter (HOSPITAL_COMMUNITY)
Admission: RE | Disposition: A | Discharge status: Home / Self Care | Payer: Self-pay | Source: Home / Self Care | Attending: General Surgery

## 2023-03-17 ENCOUNTER — Ambulatory Visit
Admission: RE | Admit: 2023-03-17 | Discharge: 2023-03-17 | Disposition: A | Discharge status: Home / Self Care | Payer: BC Managed Care – PPO | Source: Ambulatory Visit | Attending: General Surgery | Admitting: General Surgery

## 2023-03-17 DIAGNOSIS — I10 Essential (primary) hypertension: Secondary | ICD-10-CM | POA: Insufficient documentation

## 2023-03-17 DIAGNOSIS — N6011 Diffuse cystic mastopathy of right breast: Secondary | ICD-10-CM | POA: Diagnosis not present

## 2023-03-17 DIAGNOSIS — L905 Scar conditions and fibrosis of skin: Secondary | ICD-10-CM | POA: Insufficient documentation

## 2023-03-17 DIAGNOSIS — Z803 Family history of malignant neoplasm of breast: Secondary | ICD-10-CM | POA: Diagnosis not present

## 2023-03-17 DIAGNOSIS — N6489 Other specified disorders of breast: Secondary | ICD-10-CM | POA: Diagnosis present

## 2023-03-17 HISTORY — PX: BREAST LUMPECTOMY WITH RADIOACTIVE SEED LOCALIZATION: SHX6424

## 2023-03-17 SURGERY — BREAST LUMPECTOMY WITH RADIOACTIVE SEED LOCALIZATION
Anesthesia: General | Site: Breast | Laterality: Right

## 2023-03-17 MED ORDER — CHLORHEXIDINE GLUCONATE CLOTH 2 % EX PADS: 6.0000 | MEDICATED_PAD | Freq: Once | CUTANEOUS | Status: DC

## 2023-03-17 MED ORDER — GABAPENTIN 100 MG PO CAPS
100.0000 mg | ORAL_CAPSULE | ORAL | Status: AC
Start: 2023-03-17 — End: 2023-03-17
  Administered 2023-03-17: 100 mg via ORAL
  Filled 2023-03-17: qty 1

## 2023-03-17 MED ORDER — BUPIVACAINE-EPINEPHRINE 0.25% -1:200000 IJ SOLN
INTRAMUSCULAR | Status: DC | PRN
  Administered 2023-03-17: 20 mL

## 2023-03-17 MED ORDER — ACETAMINOPHEN 500 MG PO TABS
1000.0000 mg | ORAL_TABLET | ORAL | Status: AC
  Administered 2023-03-17: 1000 mg via ORAL

## 2023-03-17 MED ORDER — LIDOCAINE 2% (20 MG/ML) 5 ML SYRINGE
INTRAMUSCULAR | Status: AC
  Filled 2023-03-17: qty 5

## 2023-03-17 MED ORDER — HYDROMORPHONE HCL 1 MG/ML IJ SOLN
0.2500 mg | INTRAMUSCULAR | Status: DC | PRN
  Administered 2023-03-17: 0.5 mg via INTRAVENOUS

## 2023-03-17 MED ORDER — MIDAZOLAM HCL 2 MG/2ML IJ SOLN
INTRAMUSCULAR | Status: DC | PRN
  Administered 2023-03-17: 2 mg via INTRAVENOUS

## 2023-03-17 MED ORDER — OXYCODONE HCL 5 MG/5ML PO SOLN
5.0000 mg | Freq: Once | ORAL | Status: AC | PRN
Start: 2023-03-17 — End: 2023-03-17

## 2023-03-17 MED ORDER — SCOPOLAMINE 1 MG/3DAYS TD PT72
1.0000 | MEDICATED_PATCH | TRANSDERMAL | Status: DC
  Administered 2023-03-17: 1.5 mg via TRANSDERMAL
  Filled 2023-03-17: qty 1

## 2023-03-17 MED ORDER — DEXAMETHASONE SODIUM PHOSPHATE 10 MG/ML IJ SOLN
INTRAMUSCULAR | Status: AC
  Filled 2023-03-17: qty 1

## 2023-03-17 MED ORDER — ONDANSETRON HCL 4 MG/2ML IJ SOLN
INTRAMUSCULAR | Status: AC
  Filled 2023-03-17: qty 2

## 2023-03-17 MED ORDER — PROPOFOL 10 MG/ML IV BOLUS
INTRAVENOUS | Status: DC | PRN
  Administered 2023-03-17: 200 mg via INTRAVENOUS

## 2023-03-17 MED ORDER — LACTATED RINGERS IV SOLN: INTRAVENOUS | Status: DC

## 2023-03-17 MED ORDER — MIDAZOLAM HCL 2 MG/2ML IJ SOLN
0.5000 mg | Freq: Once | INTRAMUSCULAR | Status: DC | PRN
Start: 2023-03-17 — End: 2023-03-17

## 2023-03-17 MED ORDER — MIDAZOLAM HCL 2 MG/2ML IJ SOLN
INTRAMUSCULAR | Status: AC
  Filled 2023-03-17: qty 2

## 2023-03-17 MED ORDER — HYDROMORPHONE HCL 1 MG/ML IJ SOLN
INTRAMUSCULAR | Status: AC
  Filled 2023-03-17: qty 1

## 2023-03-17 MED ORDER — OXYCODONE HCL 5 MG PO TABS
ORAL_TABLET | ORAL | Status: AC
  Filled 2023-03-17: qty 1

## 2023-03-17 MED ORDER — PHENYLEPHRINE 80 MCG/ML (10ML) SYRINGE FOR IV PUSH (FOR BLOOD PRESSURE SUPPORT)
PREFILLED_SYRINGE | INTRAVENOUS | Status: AC
  Filled 2023-03-17: qty 10

## 2023-03-17 MED ORDER — PHENYLEPHRINE HCL-NACL 20-0.9 MG/250ML-% IV SOLN
INTRAVENOUS | Status: DC | PRN
  Administered 2023-03-17 (×2): 80 ug via INTRAVENOUS

## 2023-03-17 MED ORDER — VANCOMYCIN HCL 1.5 G IV SOLR
1500.0000 mg | INTRAVENOUS | Status: AC
  Administered 2023-03-17: 1500 mg via INTRAVENOUS
  Filled 2023-03-17: qty 30

## 2023-03-17 MED ORDER — DEXAMETHASONE SODIUM PHOSPHATE 10 MG/ML IJ SOLN
INTRAMUSCULAR | Status: DC | PRN
  Administered 2023-03-17: 10 mg via INTRAVENOUS

## 2023-03-17 MED ORDER — FENTANYL CITRATE (PF) 250 MCG/5ML IJ SOLN
INTRAMUSCULAR | Status: AC
  Filled 2023-03-17: qty 5

## 2023-03-17 MED ORDER — 0.9 % SODIUM CHLORIDE (POUR BTL) OPTIME
TOPICAL | Status: DC | PRN
  Administered 2023-03-17: 1000 mL

## 2023-03-17 MED ORDER — ONDANSETRON HCL 4 MG/2ML IJ SOLN
INTRAMUSCULAR | Status: DC | PRN
  Administered 2023-03-17: 4 mg via INTRAVENOUS

## 2023-03-17 MED ORDER — OXYCODONE HCL 5 MG PO TABS
5.0000 mg | ORAL_TABLET | Freq: Four times a day (QID) | ORAL | 0 refills | Status: AC | PRN
  Filled 2023-03-17: qty 10, 3d supply, fill #0

## 2023-03-17 MED ORDER — KETOROLAC TROMETHAMINE 30 MG/ML IJ SOLN
INTRAMUSCULAR | Status: AC
  Filled 2023-03-17: qty 1

## 2023-03-17 MED ORDER — BUPIVACAINE-EPINEPHRINE (PF) 0.25% -1:200000 IJ SOLN
INTRAMUSCULAR | Status: AC
  Filled 2023-03-17: qty 30

## 2023-03-17 MED ORDER — FENTANYL CITRATE (PF) 250 MCG/5ML IJ SOLN
INTRAMUSCULAR | Status: DC | PRN
  Administered 2023-03-17 (×2): 50 ug via INTRAVENOUS

## 2023-03-17 MED ORDER — OXYCODONE HCL 5 MG PO TABS
5.0000 mg | ORAL_TABLET | Freq: Once | ORAL | Status: AC | PRN
  Administered 2023-03-17: 5 mg via ORAL

## 2023-03-17 MED ORDER — CHLORHEXIDINE GLUCONATE 0.12 % MT SOLN
15.0000 mL | Freq: Once | OROMUCOSAL | Status: AC
  Administered 2023-03-17: 15 mL via OROMUCOSAL
  Filled 2023-03-17: qty 15

## 2023-03-17 MED ORDER — ORAL CARE MOUTH RINSE: 15.0000 mL | Freq: Once | OROMUCOSAL | Status: AC

## 2023-03-17 MED ORDER — ACETAMINOPHEN 500 MG PO TABS
1000.0000 mg | ORAL_TABLET | Freq: Once | ORAL | Status: DC
  Filled 2023-03-17: qty 2

## 2023-03-17 MED ORDER — PROPOFOL 10 MG/ML IV BOLUS
INTRAVENOUS | Status: AC
  Filled 2023-03-17: qty 20

## 2023-03-17 SURGICAL SUPPLY — 26 items
APPLIER CLIP 9.375 MED OPEN (MISCELLANEOUS) ×1
BAG COUNTER SPONGE SURGICOUNT (BAG) IMPLANT
CANISTER SUCT 3000ML PPV (MISCELLANEOUS) ×1 IMPLANT
CHLORAPREP W/TINT 26 (MISCELLANEOUS) ×1 IMPLANT
CLIP APPLIE 9.375 MED OPEN (MISCELLANEOUS) IMPLANT
COVER PROBE W GEL 5X96 (DRAPES) ×1 IMPLANT
COVER SURGICAL LIGHT HANDLE (MISCELLANEOUS) ×1 IMPLANT
DERMABOND ADVANCED .7 DNX12 (GAUZE/BANDAGES/DRESSINGS) ×1 IMPLANT
DEVICE DUBIN SPECIMEN MAMMOGRA (MISCELLANEOUS) ×1 IMPLANT
DRAPE CHEST BREAST 15X10 FENES (DRAPES) ×1 IMPLANT
ELECT COATED BLADE 2.86 ST (ELECTRODE) ×1 IMPLANT
ELECT REM PT RETURN 9FT ADLT (ELECTROSURGICAL) ×1
ELECTRODE REM PT RTRN 9FT ADLT (ELECTROSURGICAL) ×1 IMPLANT
GLOVE BIO SURGEON STRL SZ7.5 (GLOVE) ×2 IMPLANT
GOWN STRL REUS W/ TWL LRG LVL3 (GOWN DISPOSABLE) ×2 IMPLANT
KIT BASIN OR (CUSTOM PROCEDURE TRAY) ×1 IMPLANT
KIT MARKER MARGIN INK (KITS) ×1 IMPLANT
NDL HYPO 25GX1X1/2 BEV (NEEDLE) ×1 IMPLANT
NEEDLE HYPO 25GX1X1/2 BEV (NEEDLE) ×1
NS IRRIG 1000ML POUR BTL (IV SOLUTION) ×1 IMPLANT
PACK GENERAL/GYN (CUSTOM PROCEDURE TRAY) ×1 IMPLANT
SUT MNCRL AB 4-0 PS2 18 (SUTURE) ×1 IMPLANT
SUT VIC AB 3-0 SH 18 (SUTURE) ×1 IMPLANT
SYR CONTROL 10ML LL (SYRINGE) ×1 IMPLANT
TOWEL GREEN STERILE (TOWEL DISPOSABLE) ×1 IMPLANT
TOWEL GREEN STERILE FF (TOWEL DISPOSABLE) ×1 IMPLANT

## 2023-03-17 NOTE — Anesthesia Preprocedure Evaluation (Addendum)
Anesthesia Evaluation  Patient identified by MRN, date of birth, ID band Patient awake    Reviewed: Allergy & Precautions, NPO status , Patient's Chart, lab work & pertinent test results  History of Anesthesia Complications Negative for: history of anesthetic complications  Airway Mallampati: I  TM Distance: >3 FB Neck ROM: Full    Dental  (+) Caps, Dental Advisory Given, Implants   Pulmonary neg pulmonary ROS   breath sounds clear to auscultation       Cardiovascular hypertension, Pt. on medications (-) angina  Rhythm:Regular Rate:Normal     Neuro/Psych negative neurological ROS     GI/Hepatic negative GI ROS, Neg liver ROS,,,  Endo/Other    Class 4 obesity (BMI 45.7)  Renal/GU negative Renal ROS     Musculoskeletal   Abdominal   Peds  Hematology negative hematology ROS (+)   Anesthesia Other Findings   Reproductive/Obstetrics                             Anesthesia Physical Anesthesia Plan  ASA: 3  Anesthesia Plan: General   Post-op Pain Management: Tylenol PO (pre-op)* and Minimal or no pain anticipated   Induction: Intravenous  PONV Risk Score and Plan: 3 and Ondansetron, Dexamethasone and Scopolamine patch - Pre-op  Airway Management Planned: LMA  Additional Equipment: None  Intra-op Plan:   Post-operative Plan:   Informed Consent: I have reviewed the patients History and Physical, chart, labs and discussed the procedure including the risks, benefits and alternatives for the proposed anesthesia with the patient or authorized representative who has indicated his/her understanding and acceptance.     Dental advisory given  Plan Discussed with: CRNA and Surgeon  Anesthesia Plan Comments:         Anesthesia Quick Evaluation

## 2023-03-17 NOTE — Anesthesia Postprocedure Evaluation (Signed)
Anesthesia Post Note  Patient: Angel Bailey  Procedure(s) Performed: RIGHT BREAST RADIOACTIVE SEED LOCALIZED LUMPECTOMY (Right: Breast)     Patient location during evaluation: PACU Anesthesia Type: General Level of consciousness: awake and alert, patient cooperative and oriented Pain management: pain level controlled Vital Signs Assessment: post-procedure vital signs reviewed and stable Respiratory status: spontaneous breathing, nonlabored ventilation and respiratory function stable Cardiovascular status: blood pressure returned to baseline and stable Postop Assessment: no apparent nausea or vomiting and adequate PO intake Anesthetic complications: no   No notable events documented.  Last Vitals:  Vitals:   03/17/23 1315 03/17/23 1330  BP: (!) 141/98 132/87  Pulse: 61 61  Resp: 15 14  Temp:  36.5 C  SpO2: 95% 92%    Last Pain:  Vitals:   03/17/23 1330  TempSrc:   PainSc: 2                  Charnel Giles,E. Mikel Hardgrove

## 2023-03-17 NOTE — Anesthesia Procedure Notes (Signed)
Procedure Name: LMA Insertion Date/Time: 03/17/2023 11:55 AM  Performed by: Caryn Bee A, CRNAPre-anesthesia Checklist: Patient identified, Emergency Drugs available, Suction available and Patient being monitored Patient Re-evaluated:Patient Re-evaluated prior to induction Oxygen Delivery Method: Circle System Utilized Preoxygenation: Pre-oxygenation with 100% oxygen Induction Type: IV induction Ventilation: Mask ventilation without difficulty LMA: LMA inserted LMA Size: 4.0 Number of attempts: 1 Airway Equipment and Method: Bite block Placement Confirmation: positive ETCO2 Tube secured with: Tape Dental Injury: Teeth and Oropharynx as per pre-operative assessment

## 2023-03-17 NOTE — Op Note (Signed)
03/17/2023  12:33 PM  PATIENT:  Angel Bailey  57 y.o. female  PRE-OPERATIVE DIAGNOSIS:  RIGHT BREAST RADIAL SCAR  POST-OPERATIVE DIAGNOSIS:  RIGHT BREAST RADIAL SCAR  PROCEDURE:  Procedure(s): RIGHT BREAST RADIOACTIVE SEED LOCALIZED LUMPECTOMY (Right)  SURGEON:  Surgeons and Role:    * Griselda Miner, MD - Primary  PHYSICIAN ASSISTANT:   ASSISTANTS: none   ANESTHESIA:   local and general  EBL:  minimal   BLOOD ADMINISTERED:none  DRAINS: none   LOCAL MEDICATIONS USED:  MARCAINE     SPECIMEN:  Source of Specimen:  right breast tissue  DISPOSITION OF SPECIMEN:  PATHOLOGY  COUNTS:  YES  TOURNIQUET:  * No tourniquets in log *  DICTATION: .Dragon Dictation  After informed consent was obtained the patient was brought to the operating room and placed in the supine position on the operating table.  After adequate induction of general anesthesia the patient's right breast was prepped with ChloraPrep, allowed to dry, and draped in usual sterile manner.  An appropriate timeout was performed.  Previously an I-125 seed was placed in the upper portion of the right breast to mark an area of radial scar.  The neoprobe was set to I-125 in the area of radioactivity was readily identified.  The area around this was infiltrated with quarter percent Marcaine.  A curvilinear incision was then made along the upper edge of the areola of the right breast with a 15 blade knife.  The incision was carried through the skin and subcutaneous tissue sharply with the electrocautery.  Dissection was then carried towards the radioactive seed under the direction of the neoprobe.  Once I more closely approach the radioactive seed I then removed a circular portion of breast tissue sharply with the electrocautery around the radioactive seed while checking the area of radioactivity frequently.  Once the tissue was removed it was oriented with the appropriate paint colors.  A specimen radiograph was obtained that  showed the clip and seed to be near the center of the specimen.  The specimen was then sent to pathology for further evaluation.  Hemostasis was achieved using the Bovie electrocautery.  The wound was irrigated with saline and infiltrated with more quarter percent Marcaine.  The deep layer of the incision was then closed with layers of interrupted 3-0 Vicryl stitches.  The skin was then closed with interrupted 4-0 Monocryl subcuticular stitches.  Dermabond dressings were applied.  The patient tolerated the procedure well.  At the end of the case all needle sponge and instrument counts were correct.  The patient was then awakened and taken to recovery in stable condition.  PLAN OF CARE: Discharge to home after PACU  PATIENT DISPOSITION:  PACU - hemodynamically stable.   Delay start of Pharmacological VTE agent (>24hrs) due to surgical blood loss or risk of bleeding: not applicable

## 2023-03-17 NOTE — H&P (Signed)
REFERRING PHYSICIAN: Buckley, [place] J, [place] PROVIDER: Lindell Noe, MD MRN: [id number] DOB: 1965/07/24 Subjective   Chief Complaint: New Consultation (RT BREAST)  History of Present Illness: Angel Bailey is a 58 y.o. female who is seen today as an office consultation for evaluation of New Consultation (RT BREAST)  We are asked to see the patient in consultation by Dr. Denny Levy to evaluate her for a right breast radial scar. The patient is a 57 year old black female who recently went for a routine screening mammogram. At that time she was found to have a distortion in the upper portion of the right breast. It could not be measured. The distortion was biopsied and came back as a radial scar. She does have a family history of breast cancer in a maternal grandmother. She is otherwise in good health and does not smoke.  Review of Systems: A complete review of systems was obtained from the patient. I have reviewed this information and discussed as appropriate with the patient. See HPI as well for other ROS.  ROS   Medical History: Past Medical History:  Diagnosis Date  Hypertension   Patient Active Problem List  Diagnosis  Radial scar of right breast   Past Surgical History:  Procedure Laterality Date  HYSTERECTOMY  MYOMECTOMY ABDOMINAL  REMOVAL OF FALLOPIAN TUBE    Allergies  Allergen Reactions  Penicillin Swelling   Current Outpatient Medications on File Prior to Visit  Medication Sig Dispense Refill  valsartan-hydroCHLOROthiazide (DIOVAN-HCT) 160-25 mg tablet Take 1 tablet by mouth once daily   No current facility-administered medications on file prior to visit.   Family History  Problem Relation Age of Onset  Diabetes Mother  High blood pressure (Hypertension) Mother    Social History   Tobacco Use  Smoking Status Never  Smokeless Tobacco Never    Social History   Socioeconomic History  Marital status: Single  Tobacco Use  Smoking status: Never   Smokeless tobacco: Never  Vaping Use  Vaping status: Never Used  Substance and Sexual Activity  Alcohol use: Yes  Drug use: Never   Objective:   Vitals:  BP: 127/88  Pulse: 69  Temp: 36.6 C (97.9 F)  SpO2: 96%  Weight: (!) 113.7 kg (250 lb 9.6 oz)  Height: 157.5 cm (5\' 2" )  PainSc: 0-No pain   Body mass index is 45.84 kg/m.  Physical Exam Vitals reviewed.  Constitutional:  General: She is not in acute distress. Appearance: Normal appearance.  HENT:  Head: Normocephalic and atraumatic.  Right Ear: External ear normal.  Left Ear: External ear normal.  Nose: Nose normal.  Mouth/Throat:  Mouth: Mucous membranes are moist.  Pharynx: Oropharynx is clear.  Eyes:  General: No scleral icterus. Extraocular Movements: Extraocular movements intact.  Conjunctiva/sclera: Conjunctivae normal.  Pupils: Pupils are equal, round, and reactive to light.  Cardiovascular:  Rate and Rhythm: Normal rate and regular rhythm.  Pulses: Normal pulses.  Heart sounds: Normal heart sounds.  Pulmonary:  Effort: Pulmonary effort is normal. No respiratory distress.  Breath sounds: Normal breath sounds.  Abdominal:  General: Bowel sounds are normal.  Palpations: Abdomen is soft.  Tenderness: There is no abdominal tenderness.  Musculoskeletal:  General: No swelling, tenderness or deformity. Normal range of motion.  Cervical back: Normal range of motion and neck supple.  Skin: General: Skin is warm and dry.  Coloration: Skin is not jaundiced.  Neurological:  General: No focal deficit present.  Mental Status: She is alert and oriented to person, place, and  time.  Psychiatric:  Mood and Affect: Mood normal.  Behavior: Behavior normal.     Breast: There is no palpable mass in either breast. There is no palpable axillary, supraclavicular, or cervical lymphadenopathy.  Labs, Imaging and Diagnostic Testing:  Assessment and Plan:   Diagnoses and all orders for this visit:  Radial scar  of right breast - [facility] Case Posting Request; Future   The patient appears to have a radial scar in the upper portion of the right breast. Because of its appearance and because there is a 5 to 10% chance of missing something more significant the recommendation is to have this area removed. I have discussed with her in detail the risks and benefits of the operation as well as some of the technical aspects including use of a radioactive seed for localization and she understands and wishes to proceed. We will move forward with surgical scheduling.

## 2023-03-17 NOTE — Transfer of Care (Signed)
Immediate Anesthesia Transfer of Care Note  Patient: Angel Bailey  Procedure(s) Performed: RIGHT BREAST RADIOACTIVE SEED LOCALIZED LUMPECTOMY (Right: Breast)  Patient Location: PACU  Anesthesia Type:General  Level of Consciousness: awake, alert , and oriented  Airway & Oxygen Therapy: Patient Spontanous Breathing  Post-op Assessment: Report given to RN and Post -op Vital signs reviewed and stable  Post vital signs: Reviewed and stable  Last Vitals:  Vitals Value Taken Time  BP 136/86 03/17/23 1245  Temp 36.1 C 03/17/23 1242  Pulse 66 03/17/23 1246  Resp 15 03/17/23 1246  SpO2 98 % 03/17/23 1246  Vitals shown include unfiled device data.  Last Pain:  Vitals:   03/17/23 1242  TempSrc:   PainSc: 0-No pain         Complications: No notable events documented.

## 2023-03-17 NOTE — Interval H&P Note (Signed)
History and Physical Interval Note:  03/17/2023 9:24 AM  Angel Bailey  has presented today for surgery, with the diagnosis of RIGHT BREAST RADIAL SCAR.  The various methods of treatment have been discussed with the patient and family. After consideration of risks, benefits and other options for treatment, the patient has consented to  Procedure(s): RIGHT BREAST RADIOACTIVE SEED LOCALIZED LUMPECTOMY (Right) as a surgical intervention.  The patient's history has been reviewed, patient examined, no change in status, stable for surgery.  I have reviewed the patient's chart and labs.  Questions were answered to the patient's satisfaction.     Chevis Pretty III

## 2023-03-18 ENCOUNTER — Encounter (HOSPITAL_COMMUNITY): Payer: Self-pay | Admitting: General Surgery

## 2023-03-19 LAB — SURGICAL PATHOLOGY
